# Patient Record
Sex: Female | Born: 1965 | Race: White | Hispanic: No | Marital: Married | Smoking: Never smoker
Health system: Southern US, Community
[De-identification: ages and names within clinical notes are randomized; demographics above are authoritative.]

## PROBLEM LIST (undated history)

## (undated) DIAGNOSIS — Z923 Personal history of irradiation: Secondary | ICD-10-CM

## (undated) DIAGNOSIS — D229 Melanocytic nevi, unspecified: Secondary | ICD-10-CM

## (undated) DIAGNOSIS — E079 Disorder of thyroid, unspecified: Secondary | ICD-10-CM

## (undated) DIAGNOSIS — E785 Hyperlipidemia, unspecified: Secondary | ICD-10-CM

## (undated) DIAGNOSIS — I10 Essential (primary) hypertension: Secondary | ICD-10-CM

## (undated) DIAGNOSIS — D039 Melanoma in situ, unspecified: Secondary | ICD-10-CM

## (undated) DIAGNOSIS — C50919 Malignant neoplasm of unspecified site of unspecified female breast: Secondary | ICD-10-CM

## (undated) DIAGNOSIS — Z8619 Personal history of other infectious and parasitic diseases: Secondary | ICD-10-CM

## (undated) DIAGNOSIS — C4491 Basal cell carcinoma of skin, unspecified: Secondary | ICD-10-CM

## (undated) HISTORY — DX: Personal history of other infectious and parasitic diseases: Z86.19

## (undated) HISTORY — DX: Disorder of thyroid, unspecified: E07.9

## (undated) HISTORY — DX: Essential (primary) hypertension: I10

## (undated) HISTORY — DX: Malignant neoplasm of unspecified site of unspecified female breast: C50.919

## (undated) HISTORY — PX: NO PAST SURGERIES: SHX2092

---
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
## 2014-06-28 DIAGNOSIS — C50919 Malignant neoplasm of unspecified site of unspecified female breast: Secondary | ICD-10-CM

## 2014-06-28 HISTORY — DX: Malignant neoplasm of unspecified site of unspecified female breast: C50.919

## 2014-07-12 ENCOUNTER — Telehealth (INDEPENDENT_AMBULATORY_CARE_PROVIDER_SITE_OTHER): Payer: Self-pay

## 2014-07-12 ENCOUNTER — Other Ambulatory Visit (INDEPENDENT_AMBULATORY_CARE_PROVIDER_SITE_OTHER): Payer: Self-pay | Admitting: Surgery

## 2014-07-12 DIAGNOSIS — C50911 Malignant neoplasm of unspecified site of right female breast: Secondary | ICD-10-CM

## 2014-07-12 NOTE — Telephone Encounter (Signed)
Pt seen in office today by Dr Ninfa Linden for new dx right br ca and orders placed in epic for breast mri,med/rad oncology,genetic testing,and surgery. Staff message sent to Vernell Barrier to notify her about the referrals that need to be scheduled.

## 2014-07-13 ENCOUNTER — Telehealth: Payer: Self-pay | Admitting: *Deleted

## 2014-07-13 NOTE — Telephone Encounter (Signed)
Received referral from [place]. Called and left a message for the pt to return my call so I can schedule her for genetic and for Med Onc.

## 2014-07-13 NOTE — Telephone Encounter (Signed)
Pt returned my call and I confirmed 07/19/14 genetic appt and 07/20/14 med onc appt w/ her.  Mailed before appt letter, welcoming packet & intake form to pt.  Emailed Anderson [place] and [place] at [facility] to make them aware.

## 2014-07-16 ENCOUNTER — Ambulatory Visit
Admission: RE | Admit: 2014-07-16 | Discharge: 2014-07-16 | Disposition: A | Discharge status: Home / Self Care | Payer: PRIVATE HEALTH INSURANCE | Source: Ambulatory Visit | Attending: Surgery | Admitting: Surgery

## 2014-07-16 DIAGNOSIS — C50911 Malignant neoplasm of unspecified site of right female breast: Secondary | ICD-10-CM

## 2014-07-16 MED ORDER — GADOBENATE DIMEGLUMINE 529 MG/ML IV SOLN
10.0000 mL | Freq: Once | INTRAVENOUS | Status: AC | PRN
  Administered 2014-07-16: 10 mL via INTRAVENOUS

## 2014-07-17 ENCOUNTER — Other Ambulatory Visit (INDEPENDENT_AMBULATORY_CARE_PROVIDER_SITE_OTHER): Payer: Self-pay | Admitting: Surgery

## 2014-07-17 DIAGNOSIS — C50911 Malignant neoplasm of unspecified site of right female breast: Secondary | ICD-10-CM

## 2014-07-19 ENCOUNTER — Ambulatory Visit (HOSPITAL_BASED_OUTPATIENT_CLINIC_OR_DEPARTMENT_OTHER): Payer: PRIVATE HEALTH INSURANCE | Admitting: Genetic Counselor

## 2014-07-19 ENCOUNTER — Other Ambulatory Visit: Payer: PRIVATE HEALTH INSURANCE

## 2014-07-19 ENCOUNTER — Encounter: Payer: Self-pay | Admitting: Genetic Counselor

## 2014-07-19 DIAGNOSIS — Z803 Family history of malignant neoplasm of breast: Secondary | ICD-10-CM | POA: Insufficient documentation

## 2014-07-19 DIAGNOSIS — C50911 Malignant neoplasm of unspecified site of right female breast: Secondary | ICD-10-CM

## 2014-07-19 DIAGNOSIS — C50411 Malignant neoplasm of upper-outer quadrant of right female breast: Secondary | ICD-10-CM | POA: Insufficient documentation

## 2014-07-19 DIAGNOSIS — Z800 Family history of malignant neoplasm of digestive organs: Secondary | ICD-10-CM | POA: Insufficient documentation

## 2014-07-19 DIAGNOSIS — Z315 Encounter for genetic counseling: Secondary | ICD-10-CM

## 2014-07-19 DIAGNOSIS — C50611 Malignant neoplasm of axillary tail of right female breast: Secondary | ICD-10-CM

## 2014-07-19 NOTE — Progress Notes (Signed)
HISTORY OF PRESENT ILLNESS: [name], a 48 y.o. female, was seen for a cancer genetics consultation at the [facility] due to a personal and family history of cancer.  Betty Foster presents to clinic today to discuss the possibility of a hereditary predisposition to cancer, genetic testing, and to further clarify her future cancer risks, as well as potential cancer risk for family members. She was accompanied to clinic by her father.   Past Medical History  Diagnosis Date   Breast cancer 06/2014    Invasive Lobular Carcinoma dx at age 44. Planning lumpectomy on 08/03/2014. Chemotherapy and radiation to be determined.    She has no history of other cancer and is in good general health.  FAMILY HISTORY:  During the visit, a 4-generation pedigree was obtained. Significant diagnoses include the following:  Family History  Problem Relation Age of Onset   Cancer Father 19    skin cancer, ? if melanoma   Cancer Maternal Aunt 63    colorectal cancer   Cancer Paternal Aunt 73    breast cancer   Cancer Paternal Uncle 85    prostate cancer   Cancer Maternal Grandmother 55    cervical or uterine cancer   Cancer Maternal Grandfather 72    lung cancer (smoker)    Betty Foster's ancestry is of Caucasian descent. There is no known Jewish ancestry or consanguinity.  GENETIC COUNSELING ASSESSMENT: Betty Foster is a 48 y.o. female with a personal and family history of cancer suggestive of a hereditary predisposition to cancer. We, therefore, discussed and recommended the following at today's visit.   DISCUSSION: We reviewed the characteristics, features and inheritance patterns of hereditary cancer syndromes. We also discussed genetic testing, including the appropriate family members to test, the process of testing, insurance coverage and turn-around-time for results. We discussed the implications of a negative, positive and/or variant of uncertain significant result. We recommended Ms.  Foster pursue genetic testing for the BRCA1 and BRCA2 genes and/or the OvaNext gene panel.   PLAN: Based on our above recommendation, Betty Foster wished to pursue genetic testing for teh BRCA1 and BRCA2 genes. She will consider gene panel testing if her BRCA testing is negative. Her blood sample was drawn and will be sent to [facility] for analysis. Results should be available within approximately 2 weeks time, at which point they will be disclosed by telephone to Betty Foster, as will any additional recommendations warranted by these results. We also encouraged Betty Foster to remain in contact with cancer genetics annually so that we can continuously update the family history and inform her of any changes in cancer genetics and testing that may be of benefit for this family. Ms.  Foster questions were answered to her satisfaction today. Our contact information was provided should additional questions or concerns arise.   Thank you for the referral and allowing [place] to share in the care of your patient.   The patient was seen for a total of 64mnutes in face-to-face genetic counseling.  This patient was discussed with Dr. MJana Hakimwho agrees with the above.    _______________________________________________________________________ For Office Staff:  Number of people involved in session: 3 Was an Intern/ student involved with case: not applicable

## 2014-07-20 ENCOUNTER — Telehealth: Payer: Self-pay | Admitting: Hematology and Oncology

## 2014-07-20 ENCOUNTER — Encounter: Payer: Self-pay | Admitting: Hematology and Oncology

## 2014-07-20 ENCOUNTER — Ambulatory Visit (HOSPITAL_BASED_OUTPATIENT_CLINIC_OR_DEPARTMENT_OTHER): Payer: PRIVATE HEALTH INSURANCE | Admitting: Hematology and Oncology

## 2014-07-20 ENCOUNTER — Ambulatory Visit: Payer: PRIVATE HEALTH INSURANCE

## 2014-07-20 VITALS — BP 162/83 | HR 68 | Temp 98.7°F | Resp 18 | Ht 62.0 in | Wt 117.5 lb

## 2014-07-20 DIAGNOSIS — K769 Liver disease, unspecified: Secondary | ICD-10-CM

## 2014-07-20 DIAGNOSIS — C50611 Malignant neoplasm of axillary tail of right female breast: Secondary | ICD-10-CM

## 2014-07-20 DIAGNOSIS — C50411 Malignant neoplasm of upper-outer quadrant of right female breast: Secondary | ICD-10-CM

## 2014-07-20 DIAGNOSIS — Z170 Estrogen receptor positive status [ER+]: Secondary | ICD-10-CM

## 2014-07-20 NOTE — Progress Notes (Signed)
Checked in new patient with no financial issues prior to seeing the dr. She has not been out of the country and she has my card and will call if any asst is needed.

## 2014-07-20 NOTE — Assessment & Plan Note (Signed)
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 N0 M0  Pathology counseling:Discussed with the patient, the details of pathology including the type of breast cancer,the clinical staging, the significance of ER, PR and HER-2/neu receptors and the implications for treatment. After reviewing the pathology in detail, we proceeded to discuss the different treatment options between surgery, radiation, chemotherapy, antiestrogen therapies.  Oncotype DX counseling: The final pathology reveals no evidence of lymph node metastases, I would recommended Oncotype DX testing. I explained to the patient that this is a 17 gene panel to evaluate patient tumors DNA and determine using a recurrence score whether she has high risk of intermediate risk of low risk breast cancer. She understands that if her tumor was found to be high risk, she would benefit from systemic chemotherapy. If she were to lower his that there would not be any need or benefit from chemotherapy. If she was found to be intermediate risk, we would need to evaluate the score as well as other risk factors and determine if an abbreviated chemotherapy may be of benefit.  Return to clinic after surgery to discuss final pathology and to discuss adjuvant treatment plan. Liver lesions: I suspect they're hemangiomas and benign. But in order to verify, we will obtain an MRI of the liver we will try to arrange it for next Wednesday when she is coming back for radiation oncology consultation to [facility].

## 2014-07-20 NOTE — Progress Notes (Signed)
[place] CONSULT NOTE  Patient has no care team.  CHIEF COMPLAINTS/PURPOSE OF CONSULTATION:  Newly diagnosed breast cancer  HISTORY OF PRESENTING ILLNESS:  Betty Foster 48 y.o. female is here because of recent diagnosis of right breast cancer. She underwent a routine screening mammogram that revealed an abnormality which required additional imaging studies which revealed a suspicious mass in the right breast and axillary tail. She then underwent a biopsy that confirmed that this was an infiltrating lobular carcinoma grade 2. This was ER/PR positive HER-2 negative. The biopsy was done in [place]. She then sought general surgery who obtained a breast MRI on 07/17/2014 that revealed a 1.5 cm spiculated mass in the right axillary tail. There were no additional lymphadenopathy. There were a few small cysts that were felt to be benign hemangiomas. She was sent to [place] for discussion regarding adjuvant treatment options.  I reviewed her records extensively and collaborated the history with the patient.  SUMMARY OF ONCOLOGIC HISTORY:   Breast cancer of upper-outer quadrant of right female breast   07/02/2014 Initial Diagnosis Infiltrating lobular carcinoma right breast grade 2   07/17/2014 Breast MRI Small spiculated mass 1.5 cm and right axillary tail, no lymph nodes, small hepatic cysts probably benign   MEDICAL HISTORY:  Past Medical History  Diagnosis Date  . Breast cancer 06/2014    Invasive Lobular Carcinoma dx at age 65. Planning lumpectomy on 08/03/2014. Chemotherapy and radiation to be determined.    SURGICAL HISTORY: History reviewed. No pertinent past surgical history.  SOCIAL HISTORY: History   Social History  . Marital Status: Married    Spouse Name: N/A    Number of Children: N/A  . Years of Education: N/A   Occupational History  . Not on file.   Social History Main Topics  . Smoking status: Never Smoker   . Smokeless tobacco: Never Used  . Alcohol Use:  No  . Drug Use: No  . Sexual Activity: Yes    Birth Control/ Protection: Surgical     Comment: husband - vasectomy   Other Topics Concern  . Not on file   Social History Narrative  . No narrative on file    FAMILY HISTORY: Family History  Problem Relation Age of Onset  . Cancer Father 96    skin cancer, ? if melanoma  . Cancer Maternal Aunt 63    colorectal cancer  . Cancer Paternal Aunt 5    breast cancer  . Cancer Paternal Uncle 40    prostate cancer  . Cancer Maternal Grandmother 55    cervical or uterine cancer  . Cancer Maternal Grandfather 58    lung cancer (smoker)    ALLERGIES:  has no allergies on file.  MEDICATIONS:  Current Outpatient Prescriptions  Medication Sig Dispense Refill  . atorvastatin (LIPITOR) 20 MG tablet Take 20 mg by mouth daily.       No current facility-administered medications for this visit.    REVIEW OF SYSTEMS:   Constitutional: Denies fevers, chills or abnormal night sweats Eyes: Denies blurriness of vision, double vision or watery eyes Ears, nose, mouth, throat, and face: Denies mucositis or sore throat Respiratory: Denies cough, dyspnea or wheezes Cardiovascular: Denies palpitation, chest discomfort or lower extremity swelling Gastrointestinal:  Denies nausea, heartburn or change in bowel habits Skin: Denies abnormal skin rashes Lymphatics: Denies new lymphadenopathy or easy bruising Neurological:Denies numbness, tingling or new weaknesses Behavioral/Psych: Mood is stable, no new changes  Breast: Palpable mass in the right breast  axillary tail All other systems were reviewed with the patient and are negative.  PHYSICAL EXAMINATION: ECOG PERFORMANCE STATUS: 0 - Asymptomatic  Filed Vitals:   07/20/14 1316  BP: 162/83  Pulse: 68  Temp: 98.7 F (37.1 C)  Resp: 18   Filed Weights   07/20/14 1316  Weight: 117 lb 8 oz (53.298 kg)    GENERAL:alert, no distress and comfortable SKIN: skin color, texture, turgor are  normal, no rashes or significant lesions EYES: normal, conjunctiva are pink and non-injected, sclera clear OROPHARYNX:no exudate, no erythema and lips, buccal mucosa, and tongue normal  NECK: supple, thyroid normal size, non-tender, without nodularity LYMPH:  no palpable lymphadenopathy in the cervical, axillary or inguinal LUNGS: clear to auscultation and percussion with normal breathing effort HEART: regular rate & rhythm and no murmurs and no lower extremity edema ABDOMEN:abdomen soft, non-tender and normal bowel sounds Musculoskeletal:no cyanosis of digits and no clubbing  PSYCH: alert & oriented x 3 with fluent speech NEURO: no focal motor/sensory deficits BREAST: Palpable mass in the right breast axillary tail. No palpable axillary or supraclavicular lymphadenopathy  RADIOGRAPHIC STUDIES: I have personally reviewed the radiological reports and agreed with the findings in the report.  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 N0 M0  Pathology counseling:Discussed with the patient, the details of pathology including the type of breast cancer,the clinical staging, the significance of ER, PR and HER-2/neu receptors and the implications for treatment. After reviewing the pathology in detail, we proceeded to discuss the different treatment options between surgery, radiation, chemotherapy, antiestrogen therapies.  Oncotype DX counseling: The final pathology reveals no evidence of lymph node metastases, I would recommended Oncotype DX testing. I explained to the patient that this is a 17 gene panel to evaluate patient tumors DNA and determine using a recurrence score whether she has high risk of intermediate risk of low risk breast cancer. She understands that if her tumor was found to be high risk, she would  benefit from systemic chemotherapy. If she were to lower his that there would not be any need or benefit from chemotherapy. If she was found to be intermediate risk, we would need to evaluate the score as well as other risk factors and determine if an abbreviated chemotherapy may be of benefit.  Return to clinic after surgery to discuss final pathology and to discuss adjuvant treatment plan. Liver lesions: I suspect they're hemangiomas and benign. But in order to verify, we will obtain an MRI of the liver we will try to arrange it for next Wednesday when she is coming back for radiation oncology consultation to [facility].   All questions were answered. The patient knows to call the clinic with any problems, questions or concerns. I spent 40 minutes counseling the patient face to face. The total time spent in the appointment was 60 minutes and more than 50% was on counseling.     Rulon Eisenmenger, MD 07/20/2014 5:16 PM

## 2014-07-20 NOTE — Telephone Encounter (Signed)
LM to confirm appt for 11/13. [facility] call to sch MRI around appt time for radiation.

## 2014-07-23 ENCOUNTER — Encounter: Payer: Self-pay | Admitting: Radiation Oncology

## 2014-07-23 NOTE — Progress Notes (Signed)
Location of Breast Cancer: Right breast  Right upper quadrant  Histology per Pathology Report: 07/02/2014: U/S biopsy=Infiltrating lobular carcinoma (0/0) nodes examined, 2-3c  Receptor Status: ER( 3+  ), PR ( 3+  ), Her2-neu ( neg  )  Did patient present with symptoms (if so, please note symptoms) or was this found on screening mammography?: tenderness in right breast but for regular screening mammogram 06/26/14  Past/Anticipated interventions by surgeon, if any: 08/03/14 scheduled surgery for  lumpectomy   Past/Anticipated interventions by medical oncology, if any: Chemotherapy : saw Dr. Gudena 07/20/14, follow up 08/10/14, patient to have  Genetic testing for BRCA1&BRCA2 genes sent 07/19/14 for analysis results pending.  Lymphedema issues, if any:   no  Pain issues, if any:    SAFETY ISSUES:  Prior radiation? No  Pacemaker/ICD? No  Possible current pregnancy? No  Is the patient on methotrexate? No  Current Complaints / other details: Married,   Non smoker, father skin cancer,age 70,? Melanoma, Maternal aunt age 63 colorectal cancer, paternal aunt 70=breast cancer,maternal grandmother age 55=cervical or uterine cancer, maternal grandfather age 72=lung cancer, paternal uncle age 74=prostate cancer,   NKDA McElroy, Betty Bruner, Betty Foster 07/23/2014,11:08 AM   

## 2014-07-24 ENCOUNTER — Telehealth: Payer: Self-pay

## 2014-07-24 NOTE — Telephone Encounter (Signed)
LMOVM - Schedule for MRI 10/29 at 12 - arrive at 1145.  Pt to call clinic if she has any questions.

## 2014-07-25 ENCOUNTER — Ambulatory Visit: Payer: PRIVATE HEALTH INSURANCE

## 2014-07-25 ENCOUNTER — Ambulatory Visit
Admission: RE | Admit: 2014-07-25 | Discharge: 2014-07-25 | Disposition: A | Discharge status: Home / Self Care | Payer: PRIVATE HEALTH INSURANCE | Source: Ambulatory Visit | Attending: Radiation Oncology | Admitting: Radiation Oncology

## 2014-07-25 ENCOUNTER — Other Ambulatory Visit (INDEPENDENT_AMBULATORY_CARE_PROVIDER_SITE_OTHER): Payer: Self-pay | Admitting: Surgery

## 2014-07-25 DIAGNOSIS — Z510 Encounter for antineoplastic radiation therapy: Secondary | ICD-10-CM | POA: Insufficient documentation

## 2014-07-25 DIAGNOSIS — C50411 Malignant neoplasm of upper-outer quadrant of right female breast: Secondary | ICD-10-CM | POA: Insufficient documentation

## 2014-07-26 ENCOUNTER — Other Ambulatory Visit: Payer: Self-pay | Admitting: Hematology and Oncology

## 2014-07-26 ENCOUNTER — Other Ambulatory Visit: Payer: Self-pay | Admitting: *Deleted

## 2014-07-26 ENCOUNTER — Ambulatory Visit (HOSPITAL_COMMUNITY)
Admission: RE | Admit: 2014-07-26 | Discharge: 2014-07-26 | Disposition: A | Discharge status: Home / Self Care | Payer: PRIVATE HEALTH INSURANCE | Source: Ambulatory Visit | Attending: Diagnostic Radiology | Admitting: Diagnostic Radiology

## 2014-07-26 DIAGNOSIS — C50411 Malignant neoplasm of upper-outer quadrant of right female breast: Secondary | ICD-10-CM | POA: Insufficient documentation

## 2014-07-26 DIAGNOSIS — D1803 Hemangioma of intra-abdominal structures: Secondary | ICD-10-CM | POA: Diagnosis not present

## 2014-07-26 MED ORDER — GADOBENATE DIMEGLUMINE 529 MG/ML IV SOLN
10.0000 mL | Freq: Once | INTRAVENOUS | Status: AC | PRN
  Administered 2014-07-26: 10 mL via INTRAVENOUS

## 2014-07-29 HISTORY — PX: BREAST LUMPECTOMY: SHX2

## 2014-07-31 ENCOUNTER — Encounter: Payer: Self-pay | Admitting: Genetic Counselor

## 2014-07-31 ENCOUNTER — Encounter (HOSPITAL_BASED_OUTPATIENT_CLINIC_OR_DEPARTMENT_OTHER): Payer: Self-pay | Admitting: *Deleted

## 2014-07-31 DIAGNOSIS — C50411 Malignant neoplasm of upper-outer quadrant of right female breast: Secondary | ICD-10-CM

## 2014-07-31 DIAGNOSIS — Z803 Family history of malignant neoplasm of breast: Secondary | ICD-10-CM

## 2014-07-31 DIAGNOSIS — Z800 Family history of malignant neoplasm of digestive organs: Secondary | ICD-10-CM

## 2014-07-31 NOTE — Progress Notes (Signed)
Expand All Collapse All   Location of Breast Cancer: Right breast Right upper quadrant  Histology per Pathology Report: 07/02/2014: U/S biopsy=Infiltrating lobular carcinoma (0/0) nodes examined, 2-3c  07/25/14 right breast biopsy= Invasive mammary Carcinoma,appears grade II   Receptor Status: ER( 3+ ), PR ( 3+ ), Her2-neu ( neg )  Did patient present with symptoms (if so, please note symptoms) or was this found on screening mammography?: tenderness in right breast but for regular screening mammogram 06/26/14  Past/Anticipated interventions by surgeon, if any: 08/03/14 scheduled surgery for lumpectomy   Past/Anticipated interventions by medical oncology, if any: Chemotherapy : saw Dr. Gudena 07/20/14, follow up 08/10/14, patient to have Genetic testing for BRCA1&BRCA2 genes sent 07/19/14 for analysis results normal=did not reveal a deleterious mutation in either of thes genes,07/31/14 note from Catherine Fine   Lymphedema issues, if any: no  Pain issues, if any: none  SAFETY ISSUES:  Prior radiation? No  Pacemaker/ICD? No  Possible current pregnancy? No  Is the patient on methotrexate? No  Current Complaints / other details: Married,G3P2, 1 miscarriage, menses age 16, 1st pregnanacy age 24,  No HRT, birth control pils age [age] Non smoker, father skin cancer,age 70,? Melanoma, Maternal aunt age 63 colorectal cancer, paternal aunt 70=breast cancer,maternal grandmother age 55=cervical or uterine cancer, maternal grandfather age 72=lung cancer, paternal uncle age 74=prostate cancer,  NKDA      

## 2014-07-31 NOTE — Progress Notes (Signed)
HPI:  [name] was previously seen in the [place] clinic due to a personal and family history of cancer and concerns regarding a hereditary predisposition to cancer. Please refer to our prior cancer genetics clinic note for more information regarding [name]'s medical, social and family histories, and our assessment and recommendations, at the time. [name] recent genetic test results were disclosed to her, as were recommendations warranted by these results. These results and recommendations are discussed in more detail below.  GENETIC TEST RESULTS: At the time of [name]'s visit, we recommended she pursue genetic testing of the BRCA1 and BRCA2 genes. This test, which included sequencing and deletion/duplication analysis of the genes, was performed at [facility]. Genetic testing was normal, and did not reveal a deleterious mutation in either of these genes. A complete test report will be scanned into EPIC.    We discussed with [name] that since the current genetic testing is not perfect, it is possible there may be a gene mutation in one of these genes that current testing cannot detect, but that chance is small.  We also discussed, that it is possible that another gene that has not yet been discovered, or that we have not yet tested, is responsible for the cancer diagnoses in the family, and it is, therefore, important to remain in touch with cancer genetics in the future so that we can continue to offer [name] the most up to date genetic testing.   ADDITIONAL GENETIC TESTING: We discussed with [name] that there are other genes that are associated with increased cancer risk that can be analyzed. The laboratories that offer such testing look at these additional genes via a hereditary cancer gene panel. Should [name] wish to pursue such testing, we are happy to discuss and coordinate this testing at any time.    CANCER SCREENING RECOMMENDATIONS: This  result is reassuring and suggests that [name]'s cancer was most likely not due to an inherited predisposition associated with one of these genes.  Most cancers happen by chance and this negative test suggests that her cancer falls into this category.  We, therefore, recommended she continue to follow the cancer management and screening guidelines provided by her oncology and primary providers.   RECOMMENDATIONS FOR FAMILY MEMBERS:  Women in this family might be at some increased risk of developing cancer, over the general population risk, simply due to the family history of cancer.  We recommended women in this family have a yearly mammogram beginning at age 76, an an annual clinical breast exam, and perform monthly breast self-exams. Women in this family should also have a gynecological exam as recommended by their primary provider. All family members should have a colonoscopy by age 63.  FOLLOW-UP: Lastly, we discussed with [name] that cancer genetics is a rapidly advancing field and it is possible that new genetic tests will be appropriate for her and/or her family members in the future. We encouraged her to remain in contact with cancer genetics on an annual basis so we can update her personal and family histories and let her know of advances in cancer genetics that may benefit this family.   Our contact number was provided. [name] questions were answered to her satisfaction, and she knows she is welcome to call us at anytime with additional questions or concerns.   Catherine A. Fine, MS, CGC Certified Psychologist, sport [email or web address]

## 2014-08-01 ENCOUNTER — Encounter: Payer: Self-pay | Admitting: Radiation Oncology

## 2014-08-01 ENCOUNTER — Ambulatory Visit
Admission: RE | Admit: 2014-08-01 | Discharge: 2014-08-01 | Disposition: A | Discharge status: Home / Self Care | Payer: PRIVATE HEALTH INSURANCE | Source: Ambulatory Visit | Attending: Radiation Oncology | Admitting: Radiation Oncology

## 2014-08-01 VITALS — BP 139/87 | HR 67 | Temp 98.7°F | Resp 20 | Ht 62.0 in | Wt 120.8 lb

## 2014-08-01 DIAGNOSIS — C50411 Malignant neoplasm of upper-outer quadrant of right female breast: Secondary | ICD-10-CM

## 2014-08-01 DIAGNOSIS — Z510 Encounter for antineoplastic radiation therapy: Secondary | ICD-10-CM | POA: Diagnosis not present

## 2014-08-01 NOTE — Progress Notes (Signed)
Please see the Nurse Progress Note in the MD Initial Consult Encounter for this patient. 

## 2014-08-02 NOTE — H&P (Signed)
Betty Foster 07/12/2014 9:33 AM Location: [facility] Patient #: [id number] DOB: June 15, 1966 Married / Language: English / Race: White Female  History of Present Illness (Nasira Janusz A. Ninfa Linden MD; 07/12/2014 10:01 AM) Patient words: Eval. new consult for breast cancer.  The patient is a 48 year old female who presents with breast cancer. This is a pleasant female referred by Dr.Maute in [place] for the recent diagnosis of a right breast cancer. She has dense breasts. She was sent for a followup mammogram after screening mammography showed densities in the breast. The followup mammography and ultrasound demonstrated a spiculated mass in the upper outer quadrant of the right breast. It measured 1.1 cm on mammogram but only 0.8 cm on ultrasound. Ultrasound-guided core biopsy was performed and the biopsy results showed invasive lobular carcinoma. Receptor status is currently pending. She has had no previous problems with her breast. She has no history of having had previous biopsies. She denies nipple discharge. Her family history is negative for breast cancer   Other Problems Illene Regulus, MA; 07/12/2014 9:33 AM) Breast Cancer Hypercholesterolemia  Past Surgical History Illene Regulus, MA; 07/12/2014 9:33 AM) Breast Biopsy Right.  Diagnostic Studies History Lars Mage [place], [place]; 07/12/2014 9:33 AM) Colonoscopy never Mammogram within last year Pap Smear 1-5 years ago  Allergies Illene Regulus, MA; 07/12/2014 9:41 AM) No Known Drug Allergies10/15/2015  Medication History (Alisha Spillers, MA; 07/12/2014 9:41 AM) Atorvastatin Calcium (20MG  Tablet, Oral) Active. Medications Reconciled  Social History Lars Mage [place], [place]; 07/12/2014 9:33 AM) Alcohol use Occasional alcohol use. No drug use Tobacco use Never smoker.  Family History Lars Mage [place], [place]; 07/12/2014 9:33 AM) Arthritis Father. Diabetes Mellitus Father. Hypertension Father,  Mother. Melanoma Father. Thyroid problems Mother.  Pregnancy / Birth History Lars Mage [place], [place]; 07/12/2014 9:33 AM) Age at menarche 4 years. Contraceptive History Oral contraceptives. Gravida 3 Maternal age 18-25 Para 2 Regular periods  Review of Systems Lars Mage [place]; 07/12/2014 9:33 AM) General Not Present- Appetite Loss, Chills, Fatigue, Fever, Night Sweats, Weight Gain and Weight Loss. Skin Not Present- Change in Wart/Mole, Dryness, Hives, Jaundice, New Lesions, Non-Healing Wounds, Rash and Ulcer. HEENT Not Present- Earache, Hearing Loss, Hoarseness, Nose Bleed, Oral Ulcers, Ringing in the Ears, Seasonal Allergies, Sinus Pain, Sore Throat, Visual Disturbances, Wears glasses/contact lenses and Yellow Eyes. Respiratory Not Present- Bloody sputum, Chronic Cough, Difficulty Breathing, Snoring and Wheezing. Breast Present- Breast Mass. Not Present- Breast Pain, Nipple Discharge and Skin Changes. Cardiovascular Not Present- Chest Pain, Difficulty Breathing Lying Down, Leg Cramps, Palpitations, Rapid Heart Rate, Shortness of Breath and Swelling of Extremities. Gastrointestinal Not Present- Abdominal Pain, Bloating, Bloody Stool, Change in Bowel Habits, Chronic diarrhea, Constipation, Difficulty Swallowing, Excessive gas, Gets full quickly at meals, Hemorrhoids, Indigestion, Nausea, Rectal Pain and Vomiting. Female Genitourinary Not Present- Frequency, Nocturia, Painful Urination, Pelvic Pain and Urgency. Musculoskeletal Not Present- Back Pain, Joint Pain, Joint Stiffness, Muscle Pain, Muscle Weakness and Swelling of Extremities. Neurological Not Present- Decreased Memory, Fainting, Headaches, Numbness, Seizures, Tingling, Tremor, Trouble walking and Weakness. Psychiatric Not Present- Anxiety, Bipolar, Change in Sleep Pattern, Depression, Fearful and Frequent crying. Endocrine Not Present- Cold Intolerance, Excessive Hunger, Hair Changes, Heat Intolerance, Hot flashes and New  Diabetes. Hematology Not Present- Easy Bruising, Excessive bleeding, Gland problems, HIV and Persistent Infections.   Vitals (Alisha Spillers MA; 07/12/2014 9:40 AM) 07/12/2014 9:40 AM Weight: 117 lb Height: 62in Body Surface Area: 1.52 m Body Mass Index: 21.4 kg/m Pulse: 72 (Regular)  BP: 136/78 (Sitting, Left Arm, Standard)    Physical Exam (  Laila Myhre A. Ninfa Linden MD; 07/12/2014 10:04 AM) The physical exam findings are as follows: Note:Well-developed, well-nourished in no distress  Head and Neck Note: external ears and nose are normal, hearing is normal, oropharynx clear neck supple, no cervical adenopathy, no supraclavicular adenopathy, trachea is midline   Eye Note: pupils reactive bilaterally, anicteric   Chest and Lung Exam Note: clear t to auscultation bilaterally with normal respiratory effort   Breast Note: slig.t bruising at the biopsy site on the right breast.No palpable masses in either breast. Nipple and areola are normal bilaterally. No axillary adenopathy on either side   Cardiovascular Note: regular rate and rhythm with normal peripheral pulses   Abdomen Note: soft, nontender, nondistended   Neurologic Note: awake, alert, and oriented   Neuropsychiatric Note: judgment and affect are normal   Musculoskeletal Note: no long bone abnormalities     Assessment & Plan (Kameisha Malicki A. Ninfa Linden MD; 07/12/2014 10:05 AM) PRIMARY CANCER OF UPPER OUTER QUADRANT OF RIGHT BREAST (174.4  C50.411) Impression: I discussed the diagnosis with the patient and her husband in detail. I discussed removal of the cancer with breast conservation verses mastectomy. I discussed radiation as well as sentinel lymph node biopsy. After long discussion, they wished to proceed with a needle localized right breast lumpectomy and sentinel lymph node biopsy. I will go ahead and get her referred to the [facility]. I believe she does need genetic counseling as she  is young and has 2 daughters although her family history is negative. I discussed the surgical procedure in detail. I discussed the risks of surgery which includes but is not limited to bleeding, infection, need for further surgery at the margins or lymph nodes are positive, chronic arm swelling, injury to surrounding structures, etc. I also discussed postoperative recovery.

## 2014-08-03 ENCOUNTER — Ambulatory Visit
Admission: RE | Admit: 2014-08-03 | Discharge: 2014-08-03 | Disposition: A | Discharge status: Home / Self Care | Payer: PRIVATE HEALTH INSURANCE | Source: Ambulatory Visit | Attending: Surgery | Admitting: Surgery

## 2014-08-03 ENCOUNTER — Ambulatory Visit (HOSPITAL_BASED_OUTPATIENT_CLINIC_OR_DEPARTMENT_OTHER)
Admission: RE | Admit: 2014-08-03 | Discharge: 2014-08-03 | Disposition: A | Discharge status: Home / Self Care | Payer: PRIVATE HEALTH INSURANCE | Source: Ambulatory Visit | Attending: Surgery | Admitting: Surgery

## 2014-08-03 ENCOUNTER — Encounter (HOSPITAL_BASED_OUTPATIENT_CLINIC_OR_DEPARTMENT_OTHER): Payer: Self-pay | Admitting: *Deleted

## 2014-08-03 ENCOUNTER — Ambulatory Visit (HOSPITAL_COMMUNITY)
Admission: RE | Admit: 2014-08-03 | Discharge: 2014-08-03 | Disposition: A | Discharge status: Home / Self Care | Payer: PRIVATE HEALTH INSURANCE | Source: Ambulatory Visit | Attending: Surgery | Admitting: Surgery

## 2014-08-03 ENCOUNTER — Encounter (HOSPITAL_BASED_OUTPATIENT_CLINIC_OR_DEPARTMENT_OTHER)
Admission: RE | Disposition: A | Discharge status: Home / Self Care | Payer: Self-pay | Source: Ambulatory Visit | Attending: Surgery

## 2014-08-03 ENCOUNTER — Other Ambulatory Visit (INDEPENDENT_AMBULATORY_CARE_PROVIDER_SITE_OTHER): Payer: Self-pay | Admitting: Surgery

## 2014-08-03 ENCOUNTER — Ambulatory Visit (HOSPITAL_BASED_OUTPATIENT_CLINIC_OR_DEPARTMENT_OTHER): Payer: PRIVATE HEALTH INSURANCE | Admitting: Anesthesiology

## 2014-08-03 ENCOUNTER — Encounter: Payer: Self-pay | Admitting: Radiation Oncology

## 2014-08-03 DIAGNOSIS — E780 Pure hypercholesterolemia: Secondary | ICD-10-CM | POA: Diagnosis not present

## 2014-08-03 DIAGNOSIS — C50911 Malignant neoplasm of unspecified site of right female breast: Secondary | ICD-10-CM | POA: Insufficient documentation

## 2014-08-03 DIAGNOSIS — Z800 Family history of malignant neoplasm of digestive organs: Secondary | ICD-10-CM | POA: Insufficient documentation

## 2014-08-03 DIAGNOSIS — Z803 Family history of malignant neoplasm of breast: Secondary | ICD-10-CM | POA: Diagnosis not present

## 2014-08-03 DIAGNOSIS — N641 Fat necrosis of breast: Secondary | ICD-10-CM | POA: Insufficient documentation

## 2014-08-03 DIAGNOSIS — N6011 Diffuse cystic mastopathy of right breast: Secondary | ICD-10-CM | POA: Diagnosis not present

## 2014-08-03 HISTORY — DX: Hyperlipidemia, unspecified: E78.5

## 2014-08-03 HISTORY — PX: BREAST LUMPECTOMY WITH NEEDLE LOCALIZATION AND AXILLARY SENTINEL LYMPH NODE BX: SHX5760

## 2014-08-03 LAB — POCT HEMOGLOBIN-HEMACUE: HEMOGLOBIN: 13.6 g/dL (ref 12.0–15.0)

## 2014-08-03 SURGERY — BREAST LUMPECTOMY WITH NEEDLE LOCALIZATION AND AXILLARY SENTINEL LYMPH NODE BX
Anesthesia: Regional | Site: Breast | Laterality: Right

## 2014-08-03 MED ORDER — CEFAZOLIN SODIUM-DEXTROSE 2-3 GM-% IV SOLR
INTRAVENOUS | Status: AC
Start: 2014-08-03 — End: 2014-08-03
  Filled 2014-08-03: qty 50

## 2014-08-03 MED ORDER — OXYCODONE HCL 5 MG PO TABS: 5.0000 mg | ORAL_TABLET | Freq: Once | ORAL | Status: DC | PRN

## 2014-08-03 MED ORDER — METHYLENE BLUE 1 % INJ SOLN
INTRAMUSCULAR | Status: AC
  Filled 2014-08-03: qty 10

## 2014-08-03 MED ORDER — PROMETHAZINE HCL 25 MG/ML IJ SOLN: 6.2500 mg | INTRAMUSCULAR | Status: DC | PRN

## 2014-08-03 MED ORDER — BUPIVACAINE-EPINEPHRINE 0.25% -1:200000 IJ SOLN
INTRAMUSCULAR | Status: DC | PRN
  Administered 2014-08-03: 10 mL

## 2014-08-03 MED ORDER — FENTANYL CITRATE 0.05 MG/ML IJ SOLN
INTRAMUSCULAR | Status: DC | PRN
  Administered 2014-08-03: 25 ug via INTRAVENOUS
  Administered 2014-08-03: 50 ug via INTRAVENOUS
  Administered 2014-08-03: 25 ug via INTRAVENOUS

## 2014-08-03 MED ORDER — MIDAZOLAM HCL 2 MG/2ML IJ SOLN
1.0000 mg | INTRAMUSCULAR | Status: DC | PRN
  Administered 2014-08-03: 1.5 mg via INTRAVENOUS

## 2014-08-03 MED ORDER — MIDAZOLAM HCL 2 MG/2ML IJ SOLN
INTRAMUSCULAR | Status: AC
Start: 2014-08-03 — End: 2014-08-03
  Filled 2014-08-03: qty 2

## 2014-08-03 MED ORDER — BUPIVACAINE HCL (PF) 0.25 % IJ SOLN
INTRAMUSCULAR | Status: AC
  Filled 2014-08-03: qty 30

## 2014-08-03 MED ORDER — HYDROMORPHONE HCL 1 MG/ML IJ SOLN
INTRAMUSCULAR | Status: AC
  Filled 2014-08-03: qty 1

## 2014-08-03 MED ORDER — SODIUM CHLORIDE 0.9 % IJ SOLN
INTRAMUSCULAR | Status: AC
  Filled 2014-08-03: qty 10

## 2014-08-03 MED ORDER — PROPOFOL 10 MG/ML IV BOLUS
INTRAVENOUS | Status: DC | PRN
  Administered 2014-08-03: 150 mg via INTRAVENOUS

## 2014-08-03 MED ORDER — FENTANYL CITRATE 0.05 MG/ML IJ SOLN
INTRAMUSCULAR | Status: AC
  Filled 2014-08-03: qty 6

## 2014-08-03 MED ORDER — BUPIVACAINE-EPINEPHRINE (PF) 0.25% -1:200000 IJ SOLN
INTRAMUSCULAR | Status: AC
  Filled 2014-08-03: qty 30

## 2014-08-03 MED ORDER — LIDOCAINE HCL (CARDIAC) 20 MG/ML IV SOLN
INTRAVENOUS | Status: DC | PRN
  Administered 2014-08-03: 50 mg via INTRAVENOUS

## 2014-08-03 MED ORDER — HYDROMORPHONE HCL 1 MG/ML IJ SOLN
0.2500 mg | INTRAMUSCULAR | Status: DC | PRN
  Administered 2014-08-03 (×4): 0.5 mg via INTRAVENOUS

## 2014-08-03 MED ORDER — CEFAZOLIN SODIUM-DEXTROSE 2-3 GM-% IV SOLR
2.0000 g | INTRAVENOUS | Status: AC
  Administered 2014-08-03: 2 g via INTRAVENOUS

## 2014-08-03 MED ORDER — MIDAZOLAM HCL 2 MG/2ML IJ SOLN
INTRAMUSCULAR | Status: AC
  Filled 2014-08-03: qty 2

## 2014-08-03 MED ORDER — TECHNETIUM TC 99M SULFUR COLLOID FILTERED
1.0000 | Freq: Once | INTRAVENOUS | Status: AC | PRN
  Administered 2014-08-03: 1 via INTRADERMAL

## 2014-08-03 MED ORDER — METHYLENE BLUE 1 % INJ SOLN
INTRAMUSCULAR | Status: DC | PRN
  Administered 2014-08-03: 3 mL via INTRAMUSCULAR

## 2014-08-03 MED ORDER — FENTANYL CITRATE 0.05 MG/ML IJ SOLN
INTRAMUSCULAR | Status: AC
  Filled 2014-08-03: qty 2

## 2014-08-03 MED ORDER — FENTANYL CITRATE 0.05 MG/ML IJ SOLN
50.0000 ug | INTRAMUSCULAR | Status: DC | PRN
  Administered 2014-08-03: 75 ug via INTRAVENOUS

## 2014-08-03 MED ORDER — LACTATED RINGERS IV SOLN
INTRAVENOUS | Status: DC
  Administered 2014-08-03 (×3): via INTRAVENOUS

## 2014-08-03 MED ORDER — BUPIVACAINE-EPINEPHRINE (PF) 0.5% -1:200000 IJ SOLN
INTRAMUSCULAR | Status: DC | PRN
  Administered 2014-08-03: 25 mL

## 2014-08-03 MED ORDER — DEXAMETHASONE SODIUM PHOSPHATE 4 MG/ML IJ SOLN
INTRAMUSCULAR | Status: DC | PRN
  Administered 2014-08-03: 10 mg via INTRAVENOUS

## 2014-08-03 MED ORDER — KETOROLAC TROMETHAMINE 30 MG/ML IJ SOLN
INTRAMUSCULAR | Status: DC | PRN
  Administered 2014-08-03: 30 mg via INTRAVENOUS

## 2014-08-03 MED ORDER — OXYCODONE HCL 5 MG/5ML PO SOLN: 5.0000 mg | Freq: Once | ORAL | Status: DC | PRN

## 2014-08-03 MED ORDER — HYDROCODONE-ACETAMINOPHEN 5-325 MG PO TABS: 1.0000 | ORAL_TABLET | ORAL | Status: DC | PRN

## 2014-08-03 MED ORDER — ONDANSETRON HCL 4 MG/2ML IJ SOLN
INTRAMUSCULAR | Status: DC | PRN
  Administered 2014-08-03: 4 mg via INTRAVENOUS

## 2014-08-03 SURGICAL SUPPLY — 42 items
APPLIER CLIP 9.375 MED OPEN (MISCELLANEOUS) ×2
BENZOIN TINCTURE PRP APPL 2/3 (GAUZE/BANDAGES/DRESSINGS) ×2 IMPLANT
BINDER BREAST LRG (GAUZE/BANDAGES/DRESSINGS) IMPLANT
BINDER BREAST XLRG (GAUZE/BANDAGES/DRESSINGS) IMPLANT
BLADE HEX COATED 2.75 (ELECTRODE) ×2 IMPLANT
BLADE SURG 15 STRL LF DISP TIS (BLADE) ×1 IMPLANT
BLADE SURG 15 STRL SS (BLADE) ×1
CANISTER SUCT 3000ML (MISCELLANEOUS) ×2 IMPLANT
CHLORAPREP W/TINT 26ML (MISCELLANEOUS) ×2 IMPLANT
CLIP APPLIE 9.375 MED OPEN (MISCELLANEOUS) ×1 IMPLANT
COVER BACK TABLE 60X90IN (DRAPES) ×2 IMPLANT
COVER MAYO STAND STRL (DRAPES) ×2 IMPLANT
COVER PROBE W GEL 5X96 (DRAPES) ×2 IMPLANT
DEVICE DUBIN W/COMP PLATE 8390 (MISCELLANEOUS) ×2 IMPLANT
DRAPE LAPAROSCOPIC ABDOMINAL (DRAPES) ×2 IMPLANT
DRAPE UTILITY XL STRL (DRAPES) ×2 IMPLANT
DRSG TEGADERM 4X4.75 (GAUZE/BANDAGES/DRESSINGS) ×4 IMPLANT
ELECT REM PT RETURN 9FT ADLT (ELECTROSURGICAL) ×2
ELECTRODE REM PT RTRN 9FT ADLT (ELECTROSURGICAL) ×1 IMPLANT
GAUZE SPONGE 4X4 12PLY STRL (GAUZE/BANDAGES/DRESSINGS) ×2 IMPLANT
GLOVE SURG SIGNA 7.5 PF LTX (GLOVE) ×2 IMPLANT
GOWN STRL REUS W/ TWL LRG LVL3 (GOWN DISPOSABLE) ×1 IMPLANT
GOWN STRL REUS W/TWL LRG LVL3 (GOWN DISPOSABLE) ×1
KIT MARKER MARGIN INK (KITS) ×2 IMPLANT
NDL SAFETY ECLIPSE 18X1.5 (NEEDLE) ×1 IMPLANT
NEEDLE HYPO 18GX1.5 SHARP (NEEDLE) ×1
NEEDLE HYPO 25X1 1.5 SAFETY (NEEDLE) ×2 IMPLANT
NS IRRIG 1000ML POUR BTL (IV SOLUTION) ×2 IMPLANT
PACK BASIN DAY SURGERY FS (CUSTOM PROCEDURE TRAY) ×2 IMPLANT
PENCIL BUTTON HOLSTER BLD 10FT (ELECTRODE) ×2 IMPLANT
SPONGE LAP 4X18 X RAY DECT (DISPOSABLE) ×2 IMPLANT
STRIP CLOSURE SKIN 1/2X4 (GAUZE/BANDAGES/DRESSINGS) ×2 IMPLANT
SUT MON AB 4-0 PC3 18 (SUTURE) ×2 IMPLANT
SUT SILK 2 0 SH (SUTURE) IMPLANT
SUT VIC AB 2-0 SH 18 (SUTURE) IMPLANT
SUT VIC AB 3-0 SH 27 (SUTURE) ×1
SUT VIC AB 3-0 SH 27X BRD (SUTURE) ×1 IMPLANT
SYR BULB 3OZ (MISCELLANEOUS) ×2 IMPLANT
SYR CONTROL 10ML LL (SYRINGE) ×2 IMPLANT
TOWEL OR 17X24 6PK STRL BLUE (TOWEL DISPOSABLE) ×2 IMPLANT
TUBE CONNECTING 20X1/4 (TUBING) ×2 IMPLANT
YANKAUER SUCT BULB TIP NO VENT (SUCTIONS) ×2 IMPLANT

## 2014-08-03 NOTE — Anesthesia Preprocedure Evaluation (Signed)
Anesthesia Evaluation  Patient identified by MRN, date of birth, ID band Patient awake    Reviewed: Allergy & Precautions, H&P , NPO status   History of Anesthesia Complications Negative for: history of anesthetic complications  Airway Mallampati: I       Dental   Pulmonary neg pulmonary ROS,  breath sounds clear to auscultation        Cardiovascular negative cardio ROS  Rhythm:Regular Rate:Normal     Neuro/Psych negative neurological ROS     GI/Hepatic Neg liver ROS,   Endo/Other  negative endocrine ROS  Renal/GU negative Renal ROS     Musculoskeletal   Abdominal   Peds  Hematology negative hematology ROS (+)   Anesthesia Other Findings   Reproductive/Obstetrics                             Anesthesia Physical Anesthesia Plan  ASA: I  Anesthesia Plan: General and Regional   Post-op Pain Management:    Induction: Intravenous  Airway Management Planned: LMA  Additional Equipment:   Intra-op Plan:   Post-operative Plan: Extubation in OR  Informed Consent: I have reviewed the patients History and Physical, chart, labs and discussed the procedure including the risks, benefits and alternatives for the proposed anesthesia with the patient or authorized representative who has indicated his/her understanding and acceptance.   Dental advisory given  Plan Discussed with: CRNA and Surgeon  Anesthesia Plan Comments:         Anesthesia Quick Evaluation

## 2014-08-03 NOTE — Op Note (Signed)
NEEDLE LOCALIZED RIGHT BREAST LUMPECTOMY AND RIGHT  SENTINEL LYMPH NODE BX  Procedure Note  Betty Foster 08/03/2014   Pre-op Diagnosis: Right Breast Cancer     Post-op Diagnosis: SAME  Procedure(s): NEEDLE LOCALIZED RIGHT BREAST PARTIAL MASTECTOMY AND RIGHT  SENTINEL LYMPH NODE BX INJECTION OF BLUE DYE  Surgeon(s): Coralie Keens, MD  Anesthesia: General  Staff:  Circulator: Lauris Chroman, RN Scrub Person: Vara Guardian, RN  Estimated Blood Loss: Minimal               Specimens: sent to path          [facility]   Date: 08/03/2014  Time: 3:38 PM

## 2014-08-03 NOTE — Progress Notes (Signed)
Radiology staff present for nuc med inj. Pt tol well, no additional sedation required. See doc flowsheets.

## 2014-08-03 NOTE — Discharge Instructions (Signed)
[facility] °Office Phone Number [phone] ° °BREAST BIOPSY/ PARTIAL MASTECTOMY: POST OP INSTRUCTIONS ° °Always review your discharge instruction sheet given to you by the facility where your surgery was performed. ° °IF YOU HAVE DISABILITY OR FAMILY LEAVE FORMS, YOU MUST BRING THEM TO THE OFFICE FOR PROCESSING.  DO NOT GIVE THEM TO YOUR DOCTOR. ° °1. A prescription for pain medication may be given to you upon discharge.  Take your pain medication as prescribed, if needed.  If narcotic pain medicine is not needed, then you may take acetaminophen (Tylenol) or ibuprofen (Advil) as needed. °2. Take your usually prescribed medications unless otherwise directed °3. If you need a refill on your pain medication, please contact your pharmacy.  They will contact our office to request authorization.  Prescriptions will not be filled after 5pm or on week-ends. °4. You should eat very light the first 24 hours after surgery, such as soup, crackers, pudding, etc.  Resume your normal diet the day after surgery. °5. Most patients will experience some swelling and bruising in the breast.  Ice packs and a good support bra will help.  Swelling and bruising can take several days to resolve.  °6. It is common to experience some constipation if taking pain medication after surgery.  Increasing fluid intake and taking a stool softener will usually help or prevent this problem from occurring.  A mild laxative (Milk of Magnesia or Miralax) should be taken according to package directions if there are no bowel movements after 48 hours. °7. Unless discharge instructions indicate otherwise, you may remove your bandages 24-48 hours after surgery, and you may shower at that time.  You may have steri-strips (small skin tapes) in place directly over the incision.  These strips should be left on the skin for 7-10 days.  If your surgeon used skin glue on the incision, you may shower in 24 hours.  The glue will flake off over the  next 2-3 weeks.  Any sutures or staples will be removed at the office during your follow-up visit. °8. ACTIVITIES:  You may resume regular daily activities (gradually increasing) beginning the next day.  Wearing a good support bra or sports bra minimizes pain and swelling.  You may have sexual intercourse when it is comfortable. °a. You may drive when you no longer are taking prescription pain medication, you can comfortably wear a seatbelt, and you can safely maneuver your car and apply brakes. °b. RETURN TO WORK:  ______________________________________________________________________________________ °9. You should see your doctor in the office for a follow-up appointment approximately two weeks after your surgery.  Your doctor’s nurse will typically make your follow-up appointment when she calls you with your pathology report.  Expect your pathology report 2-3 business days after your surgery.  You may call to check if you do not hear from us after three days. °10. OTHER INSTRUCTIONS: _______________________________________________________________________________________________ _____________________________________________________________________________________________________________________________________ °_____________________________________________________________________________________________________________________________________ °_____________________________________________________________________________________________________________________________________ ° °WHEN TO CALL YOUR DOCTOR: °1. Fever over 101.0 °2. Nausea and/or vomiting. °3. Extreme swelling or bruising. °4. Continued bleeding from incision. °5. Increased pain, redness, or drainage from the incision. ° °The clinic staff is available to answer your questions during regular business hours.  Please don’t hesitate to call and ask to speak to one of the nurses for clinical concerns.  If you have a medical emergency, go to the nearest  emergency room or call 911.  A surgeon from [facility] is always on call at the hospital. ° °For further questions, please visit [email or web address]  ° ° °  Post Anesthesia Home Care Instructions ° °Activity: °Get plenty of rest for the remainder of the day. A responsible adult should stay with you for 24 hours following the procedure.  °For the next 24 hours, DO NOT: °-Drive a car °-Operate machinery °-Drink alcoholic beverages °-Take any medication unless instructed by your physician °-Make any legal decisions or sign important papers. ° °Meals: °Start with liquid foods such as gelatin or soup. Progress to regular foods as tolerated. Avoid greasy, spicy, heavy foods. If nausea and/or vomiting occur, drink only clear liquids until the nausea and/or vomiting subsides. Call your physician if vomiting continues. ° °Special Instructions/Symptoms: °Your throat may feel dry or sore from the anesthesia or the breathing tube placed in your throat during surgery. If this causes discomfort, gargle with warm salt water. The discomfort should disappear within 24 hours. ° °

## 2014-08-03 NOTE — Progress Notes (Signed)
Assisted Dr. Massagee with right, ultrasound guided, pectoralis block. Side rails up, monitors on throughout procedure. See vital signs in flow sheet. Tolerated Procedure well. 

## 2014-08-03 NOTE — Anesthesia Procedure Notes (Addendum)
Anesthesia Regional Block:  Pectoralis block  Pre-Anesthetic Checklist: ,, timeout performed, Correct Patient, Correct Site, Correct Laterality, Correct Procedure, Correct Position, site marked, Risks and benefits discussed,  Surgical consent,  Pre-op evaluation,  At surgeon's request and post-op pain management  Laterality: Right and Upper  Prep: chloraprep       Needles:   Needle Type: Echogenic Needle     Needle Length: 9cm 9 cm Needle Gauge: 21 and 21 G  Needle insertion depth: 5 cm   Additional Needles:  Procedures: ultrasound guided (picture in chart) Pectoralis block Narrative:  Start time: 08/03/2014 1:35 PM End time: 08/03/2014 1:50 PM Injection made incrementally with aspirations every 5 mL.  Performed by: Personally   Additional Notes: Tolerated well   Procedure Name: LMA Insertion Date/Time: 08/03/2014 2:52 PM Performed by: Lyndee Leo Pre-anesthesia Checklist: Patient identified, Emergency Drugs available, Suction available and Patient being monitored Patient Re-evaluated:Patient Re-evaluated prior to inductionOxygen Delivery Method: Circle System Utilized Preoxygenation: Pre-oxygenation with 100% oxygen Intubation Type: IV induction Ventilation: Mask ventilation without difficulty LMA: LMA inserted LMA Size: 4.0 Number of attempts: 1 Airway Equipment and Method: bite block Placement Confirmation: positive ETCO2 Tube secured with: Tape Dental Injury: Teeth and Oropharynx as per pre-operative assessment

## 2014-08-03 NOTE — Progress Notes (Addendum)
Radiation Oncology         [phone] ________________________________  Name: Betty Foster MRN: [id number]  Date: 08/01/2014  DOB: Feb 16, 1966  QA:[id number] T, MD  Coralie Keens, MD     REFERRING PHYSICIAN: Coralie Keens, MD   DIAGNOSIS: The encounter diagnosis was Breast cancer of upper-outer quadrant of right female breast.   HISTORY OF PRESENT ILLNESS::Betty Foster is a 48 y.o. female who is seen for an initial consultation visit regarding the patient's diagnosis of breast cancer.  The patient was found to have suspicious findings within the right breast on initial mammogram. The patient has not had symptoms prior to this study. A diagnostic mammogram and breast ultrasound confirmed this finding. On ultrasound, the tumor measured 0.8 cm and was present in the upper outer quadrant.  A biopsy was performed. 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 The tumor was felt to be most consistent with lobular phenotype. Receptors studies were completed and indicate that the tumor is estrogen receptor positive, progesterone receptor positive, and Her-2/neu negative. %.   The patient has undergone an MRI scan of the breasts: this revealed a small spiculated 1.57 m mass within the right upper outer quadrant consistent with the known malignancy. No suspicious adenopathy or other breast lesion. Multiple small hepatic lesions were noted on this exam. This led to a MRI scan of the liver which did not reveal any evidence for metastatic disease.    PREVIOUS RADIATION THERAPY: No   PAST MEDICAL HISTORY:  has a past medical history of Breast cancer (06/2014) and Hyperlipemia.     PAST SURGICAL HISTORY: Past Surgical History  Procedure Laterality Date  . No past surgeries       FAMILY HISTORY: family history includes Cancer (age of onset: 53) in her maternal grandmother; Cancer (age of onset: 67) in her maternal aunt; Cancer (age of onset: 43) in her father  and paternal aunt; Cancer (age of onset: 40) in her maternal grandfather; Cancer (age of onset: 42) in her paternal uncle.   SOCIAL HISTORY:  reports that she has never smoked. She has never used smokeless tobacco. She reports that she does not drink alcohol or use illicit drugs.   ALLERGIES: Review of patient's allergies indicates no known allergies.   MEDICATIONS:  No current facility-administered medications for this encounter.   Current Outpatient Prescriptions  Medication Sig Dispense Refill  . HYDROcodone-acetaminophen (NORCO) 5-325 MG per tablet Take 1-2 tablets by mouth every 4 (four) hours as needed. 40 tablet 0   Facility-Administered Medications Ordered in Other Encounters  Medication Dose Route Frequency Provider Last Rate Last Dose  . fentaNYL (SUBLIMAZE) injection 50-100 mcg  50-100 mcg Intravenous PRN Napoleon Form, MD   75 mcg at 08/03/14 1340  . HYDROmorphone (DILAUDID) injection 0.25-0.5 mg  0.25-0.5 mg Intravenous Q5 min PRN Rica Koyanagi, MD   0.5 mg at 08/03/14 1641  . lactated ringers infusion   Intravenous Continuous Napoleon Form, MD 10 mL/hr at 08/03/14 1642    . midazolam (VERSED) injection 1-2 mg  1-2 mg Intravenous PRN Napoleon Form, MD   1.5 mg at 08/03/14 1340  . oxyCODONE (Oxy IR/ROXICODONE) immediate release tablet 5 mg  5 mg Oral Once PRN Rica Koyanagi, MD       Or  . oxyCODONE (ROXICODONE) 5 MG/5ML solution 5 mg  5 mg Oral Once PRN Rica Koyanagi, MD      . promethazine (PHENERGAN) injection 6.25-12.5 mg  6.25-12.5 mg Intravenous Q15  min PRN Rica Koyanagi, MD         REVIEW OF SYSTEMS:  A 15 point review of systems is documented in the electronic medical record. This was obtained by the nursing staff. However, I reviewed this with the patient to discuss relevant findings and make appropriate changes.  Pertinent items are noted in HPI.    PHYSICAL EXAM:  height is '5\' 2"'  (1.575 m) and weight is 120 lb 12.8 oz (54.795 kg). Her oral temperature is 98.7  F (37.1 C). Her blood pressure is 139/87 and her pulse is 67. Her respiration is 20.   ECOG = 1  0 - Asymptomatic (Fully active, able to carry on all predisease activities without restriction)  1 - Symptomatic but completely ambulatory (Restricted in physically strenuous activity but ambulatory and able to carry out work of a light or sedentary nature. For example, light housework, office work)  2 - Symptomatic, <50% in bed during the day (Ambulatory and capable of all self care but unable to carry out any work activities. Up and about more than 50% of waking hours)  3 - Symptomatic, >50% in bed, but not bedbound (Capable of only limited self-care, confined to bed or chair 50% or more of waking hours)  4 - Bedbound (Completely disabled. Cannot carry on any self-care. Totally confined to bed or chair)  5 - Death   Eustace Pen MM, Creech RH, Tormey DC, et al. [phone]). "Toxicity and response criteria of the [facility]". [place] Oncol. 5 (6): 649-55  General: Well-developed, in no acute distress HEENT: Normocephalic, atraumatic; oral cavity clear Neck: Supple without any lymphadenopathy Cardiovascular: Regular rate and rhythm Respiratory: Clear to auscultation bilaterally Breasts: palpable mass in the right axillary tail region. No suspicious lymphadenopathy on the right. No breast masses within the left breast and no axillary adenopathy on this side GI: Soft, nontender, normal bowel sounds Extremities: No edema present Neuro: No focal deficits     LABORATORY DATA:  Lab Results  Component Value Date   HGB 13.6 08/03/2014   No results found for: NA, K, CL, CO2 No results found for: ALT, AST, GGT, ALKPHOS, BILITOT    RADIOGRAPHY: Mr Liver W Wo Contrast  07/26/2014   CLINICAL DATA:  Liver lesions on prior breast MRI. Breast cancer.Breast cancer of upper-outer quadrant of right female breast C50.411 (ICD-10-CM).  EXAM: MRI ABDOMEN WITHOUT AND WITH CONTRAST   TECHNIQUE: Multiplanar multisequence MR imaging of the abdomen was performed both before and after the administration of intravenous contrast.  CONTRAST:  97m MULTIHANCE GADOBENATE DIMEGLUMINE 529 MG/ML IV SOLN  COMPARISON:  Breast MRI of 06/1914  FINDINGS: Lower chest: Normal heart size without pericardial or pleural effusion.  Hepatobiliary: 3 liver lesions which are most consistent with hemangiomas. Posterior segment right liver lobe lesion, immediately posterior to the right hepatic vein. 1.1 cm on image 17 of series 3. Just cephalad and lateral to this is a 1.7 cm lesion on image 14 of series 3. These lesions both demonstrate T2 hyperintensity and early peripheral nodular enhancement.  Smaller lesion within the more lateral right liver lobe (likely the anterior segment) measures on the order of 7 mm on image 15 of series 3. Demonstrates delayed hyper enhancement.  No suspicious liver lesion. Normal gallbladder, without biliary ductal dilatation.  Pancreas: Normal, without mass or pancreatic ductal dilatation.  Spleen: 5 mm T2 hyperintense focus within the anterior spleen is likely a cyst or tiny lymphangioma. Of no clinical significance.  Adrenals/Urinary  Tract: Normal adrenal glands. Normal kidneys, without hydronephrosis.  Stomach/Bowel: Normal stomach and small bowel loops. Colonic stool burden suggests constipation.  Vascular/Lymphatic: Normal aorta and branch vessels. No abdominal adenopathy.  Other:  No ascites.  Musculoskeletal: No focal osseous abnormality.  IMPRESSION: 1. Three right liver lobe hemangiomas.  No suspicious liver lesion. 2. No evidence of metastatic disease or acute process within the abdomen. 3.  Possible constipation.   Electronically Signed   By: Abigail Miyamoto M.D.   On: 07/26/2014 15:16   Mr Breast Bilateral W Wo Contrast  07/17/2014   CLINICAL DATA:  Infiltrating lobular carcinoma of the right breast following core needle biopsy diagnosed in [place] on 07/02/2014.  Patient has a family history positive for breast cancer in a maternal aunt diagnosed between the ages of 77 and 71.  EXAM: BILATERAL BREAST MRI WITH AND WITHOUT CONTRAST  TECHNIQUE: Multiplanar, multisequence MR images of both breasts were obtained prior to and following the intravenous administration of 4m of MultiHance.  THREE-DIMENSIONAL MR IMAGE RENDERING ON INDEPENDENT WORKSTATION:  Three-dimensional MR images were rendered by post-processing of the original MR data on an independent workstation. The three-dimensional MR images were interpreted, and findings are reported in the following complete MRI report for this study. Three dimensional images were evaluated at the independent DynaCad workstation  [facility]  [facility]  FINDINGS: Breast composition: c:  Heterogeneous fibroglandular tissue  Background parenchymal enhancement: Moderate  Right breast: Within the right axillary tail there is an enhancing spiculated mass which measures 1.5 x 1.5 x 1.1 cm. This demonstrates rapid wash-in and washout type enhancement kinetics. No signal void artifact to indicate presence of a clip within the mass. No other suspicious enhancement is identified within the right breast.  Left breast: No mass or abnormal enhancement.  Lymph nodes: No abnormal appearing lymph nodes.  Ancillary findings: Within the right hepatic lobe there is a T2 bright lesion measuring 2.0 cm. This demonstrates typical nodular wash-in enhancement pattern consistent with hemangioma. Just lateral to this lesion there is a smaller 0.8 cm lesion likely also representing hemangioma. Posterior to the right hepatic vein there is a 1.0 cm T2 bright lesion not well evaluated on postcontrast images. Although favored to represent benign lesion this lesion is not fully characterized.  IMPRESSION: 1. Small spiculated mass measure 1.5 cm within the right  axillary tail consistent with known malignancy. No definite clip artifact identified within the mass. 2. No suspicious adenopathy or other breast lesion identified. 3. Multiple small hepatic lesions favored to be benign but not completely characterized. Full characterization with abdominal MRI is recommended.  RECOMMENDATION: 1. Treatment plan regarding known right breast cancer. 2. Abdominal MRI to characterize liver lesions.  BI-RADS CATEGORY  6: Known biopsy-proven malignancy.   Electronically Signed   By: BShon HaleM.D.   On: 07/17/2014 12:35   Mm Breast Surgical Specimen  08/03/2014   CLINICAL DATA:  48year old female with right breast carcinoma- evaluate lumpectomy specimen  EXAM: SPECIMEN RADIOGRAPH OF THE RIGHT BREAST  COMPARISON:  Previous exam(s)  FINDINGS: Status post excision of the right breast. The wire tip is present and marked for pathology. The mass is faint but identified.  IMPRESSION: Specimen radiograph of the right breast.   Electronically Signed   By: JHassan RowanM.D.   On: 08/03/2014 15:50   [place] Breast Loc Dev   1st Lesion  Inc [place]  08/03/2014   CLINICAL DATA:  48 year old female with right breast cancer. Wire localization prior to right lumpectomy.  EXAM: NEEDLE LOCALIZATION OF THE RIGHT BREAST WITH ULTRASOUND GUIDANCE  COMPARISON:  Previous exams.  FINDINGS: Patient presents for needle localization prior to right lumpectomy. I met with the patient and we discussed the procedure of needle localization including benefits and alternatives. We discussed the high likelihood of a successful procedure. We discussed the risks of the procedure, including infection, bleeding, tissue injury, and further surgery. Informed, written consent was given. The usual time-out protocol was performed immediately prior to the procedure.  Using ultrasound guidance, sterile technique, 2% lidocaine and a 7 cm modified Kopans needle, the mass in the right axillary tail localized using a lateral  approach. A skin mark was placed directly over the mass. The films were marked for Dr. Ninfa Linden.  IMPRESSION: Needle localization right breast. No apparent complications.   Electronically Signed   By: Hassan Rowan M.D.   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 She appears to be a good candidate for breast conservation treatment.  I discussed with the patient the role of adjuvant radiation treatment in this setting. We discussed the potential benefit of radiation treatment, especially with regards to local control of the patient's tumor. We also discussed the possible side effects and risks of such a treatment as well.  All of the patient's questions were answered. The patient wishes to proceed with radiation treatment at the appropriate time.  PLAN: I look forward to seeing the patient postoperatively to review her case and further discuss and coordinate an anticipated course of radiation treatment. The patient wishes to be treated in [place]. This certainly can be accomplished and I discussed this with the patient. I will see her today her postoperatively after she has seen medical oncology.   I spent 60 minutes face to face with the patient and more than 50% of that time was spent in counseling and/or coordination of care.    ________________________________   Jodelle Gross, MD, PhD   **Disclaimer: This note was dictated with  voice recognition software. Similar sounding words can inadvertently be transcribed and this note may contain transcription errors which may not have been corrected upon publication of note.**

## 2014-08-03 NOTE — Addendum Note (Signed)
Encounter addended by: Marye Round, MD on: 08/03/2014  4:57 PM<BR>     Documentation filed: Notes Section

## 2014-08-03 NOTE — Transfer of Care (Signed)
Immediate Anesthesia Transfer of Care Note  Patient: Betty Foster  Procedure(s) Performed: Procedure(s): NEEDLE LOCALIZED RIGHT BREAST LUMPECTOMY AND RIGHT  SENTINEL LYMPH NODE BX (Right)  Patient Location: PACU  Anesthesia Type:GA combined with regional for post-op pain  Level of Consciousness: awake, sedated and patient cooperative  Airway & Oxygen Therapy: Patient Spontanous Breathing and Patient connected to face mask oxygen  Post-op Assessment: Report given to PACU RN and Post -op Vital signs reviewed and stable  Post vital signs: Reviewed and stable  Complications: No apparent anesthesia complications

## 2014-08-03 NOTE — Interval H&P Note (Signed)
History and Physical Interval Note: no change in H and P  08/03/2014 2:21 PM  [facility]  has presented today for surgery, with the diagnosis of Right Breast Cancer  The various methods of treatment have been discussed with the patient and family. After consideration of risks, benefits and other options for treatment, the patient has consented to  Procedure(s): NEEDLE LOCALIZED RIGHT BREAST LUMPECTOMY AND RIGHT  SENTINEL LYMPH NODE BX (Right) as a surgical intervention .  The patient's history has been reviewed, patient examined, no change in status, stable for surgery.  I have reviewed the patient's chart and labs.  Questions were answered to the patient's satisfaction.     Jestina Stephani A

## 2014-08-03 NOTE — Anesthesia Postprocedure Evaluation (Signed)
  Anesthesia Post-op Note  Patient: Betty Foster  Procedure(s) Performed: Procedure(s): NEEDLE LOCALIZED RIGHT BREAST LUMPECTOMY AND RIGHT  SENTINEL LYMPH NODE BX (Right)  Patient Location: PACU  Anesthesia Type:General  Level of Consciousness: awake and sedated  Airway and Oxygen Therapy: Patient Spontanous Breathing  Post-op Pain: mild  Post-op Assessment: Post-op Vital signs reviewed  Post-op Vital Signs: stable  Last Vitals:  Filed Vitals:   08/03/14 1700  BP: 148/85  Pulse: 66  Temp:   Resp: 8    Complications: No apparent anesthesia complications

## 2014-08-04 NOTE — Op Note (Signed)
[name], Foster NO.:  [id number]  MEDICAL RECORD NO.:  [id number]  LOCATION:  [facility]                          FACILITY:  [facility]  PHYSICIAN:  Betty Foster, M.D. DATE OF BIRTH:  Feb 09, 1966  DATE OF PROCEDURE:  08/03/2014 DATE OF DISCHARGE:                              OPERATIVE REPORT   PREOPERATIVE DIAGNOSIS:  Right breast cancer.  POSTOPERATIVE DIAGNOSIS:  Right breast cancer.  PROCEDURE: 1. Needle-localized right breast partial mastectomy and right sentinel     lymph node biopsy. 2. Injection of blue dye.  SURGEON:  Betty Foster, M.D.  ANESTHESIA:  General and 0.5% Marcaine.  ESTIMATED BLOOD LOSS:  Minimal.  INDICATIONS:  This is a 48 year old female who was found on mammography to have a 0.8 cm mass in the upper outer quadrant of the right breast. Stereotactic biopsy showed invasive lobular carcinoma.  Decision was made to proceed with partial mastectomy under needle localization.  FINDINGS:  The patient was found to have 1 sentinel lymph node, but 2 other nodes were removed that were adjacent to this.  The large partial mastectomy incision was excised.  By the radiologist's report who placed the wire he had completely speared the mass with the wire.  On postoperative x-ray, I could visualize what appeared to be the mass right where he said it was, but the radiologist who had not placed the wire reading the film was not as certain.  The specimen was marked with a marker paint and sent to Pathology for evaluation.  PROCEDURE IN DETAIL:  The patient was brought to the operating room, identified as Betty Foster.  She had already had a localization wire placed in the right upper quadrant of the right breast and had injection of radioactive isotope into the right areola.  She was taken to the operating room and placed on the operating table in supine position and general anesthesia was induced.  Her right breast and chest were then prepped and  draped in the usual sterile fashion after I injected blue dye underneath the areola and massaged the breast.  The localization wire was in the upper outer quadrant and X had been marked on the breast at the area of the mass.  I made an elliptical incision on the breast overlying the X and took this down into the breast tissue.  The localization wire was easily identified and brought into the incision. I then performed a wide partial mastectomy including all the upper outer quadrant of the breast.  This went all way down to the chest wall and the edge of the pectoralis muscle.  The wide local excision was then completely performed with wide margins around the wire.  I then marked this with marker paint.  X-ray was then performed, it appeared that the mass was in the specimen, although the radiologist was not quite certain.  I took no further tissue as she was quite small breasted and this appeared to be exactly the area of concern with the previous report that the wire had completely gone through the mass.  The mass was sent to Pathology for evaluation.  As the incision was in the upper outer quadrant, I then  brought the Neoprobe on the field and could easily identify a sentinel lymph node that contained blue dye.  While excising this lymph node, there were 2 lymph nodes adjacent to this, which I removed in mass.  After this was sent to Pathology for evaluation, I again evaluated the nodal basin and found no other increased uptake of radioactive isotope.  I then irrigated the wound with saline. Hemostasis appeared to be achieved with the cautery.  I placed surgical clips around the biopsy cavity.  I closed the subcutaneous tissue with interrupted 3-0 Vicryl sutures and closed the skin with a running 4-0 Monocryl.  Steri-Strips, gauze, and Tegaderm were then applied.  The patient tolerated the procedure well.  All the counts were correct at the end of procedure.  The patient was then  extubated in the operating room and taken in a stable condition to the recovery room.     Betty Foster, M.D.     DB/MEDQ  D:  08/03/2014  T:  08/04/2014  Job:  [id number]

## 2014-08-06 ENCOUNTER — Encounter (HOSPITAL_BASED_OUTPATIENT_CLINIC_OR_DEPARTMENT_OTHER): Payer: Self-pay | Admitting: Surgery

## 2014-08-10 ENCOUNTER — Encounter: Payer: Self-pay | Admitting: *Deleted

## 2014-08-10 ENCOUNTER — Ambulatory Visit (HOSPITAL_BASED_OUTPATIENT_CLINIC_OR_DEPARTMENT_OTHER): Payer: PRIVATE HEALTH INSURANCE | Admitting: Hematology and Oncology

## 2014-08-10 ENCOUNTER — Telehealth: Payer: Self-pay | Admitting: Hematology and Oncology

## 2014-08-10 VITALS — BP 142/91 | HR 68 | Temp 98.3°F | Resp 18 | Ht 62.0 in | Wt 123.2 lb

## 2014-08-10 DIAGNOSIS — Z170 Estrogen receptor positive status [ER+]: Secondary | ICD-10-CM

## 2014-08-10 DIAGNOSIS — C50411 Malignant neoplasm of upper-outer quadrant of right female breast: Secondary | ICD-10-CM

## 2014-08-10 NOTE — Telephone Encounter (Signed)
gv pt appt schedule for dec.  °

## 2014-08-10 NOTE — Progress Notes (Signed)
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 N0 M0 stage IA    CHIEF COMPLIANT: followup after surgery  INTERVAL HISTORY: Betty Foster is a 48 year old Caucasian with above-mentioned history of right-sided invasive lobular cancer she underwent recent lumpectomy and is here today to discuss results. She is doing very well without any major problems or concerns. She is recovering very well from surgery. She is accompanied by her daughter. She had BRCA testing which were both negative. She is here to discuss final pathology report and to discuss further treatment plan.  REVIEW OF SYSTEMS:   Constitutional: Denies fevers, chills or abnormal weight loss Eyes: Denies blurriness of vision Ears, nose, mouth, throat, and face: Denies mucositis or sore throat Respiratory: Denies cough, dyspnea or wheezes Cardiovascular: Denies palpitation, chest discomfort or lower extremity swelling Gastrointestinal:  Denies nausea, heartburn or change in bowel habits Skin: Denies abnormal skin rashes Lymphatics: Denies new lymphadenopathy or easy bruising Neurological:Denies numbness, tingling or new weaknesses Behavioral/Psych: Mood is stable, no new changes  Breast:slightly sore from recent surgery. All other  systems were reviewed with the patient and are negative.  I have reviewed the past medical history, past surgical history, social history and family history with the patient and they are unchanged from previous note.  ALLERGIES:  has No Known Allergies.  MEDICATIONS:  Current Outpatient Prescriptions  Medication Sig Dispense Refill  . atorvastatin (LIPITOR) 20 MG tablet Take 20 mg by mouth daily.    Marland Kitchen HYDROcodone-acetaminophen (NORCO) 5-325 MG per tablet Take 1-2 tablets by mouth every 4 (four) hours as needed. 40 tablet 0   No current facility-administered medications for this visit.    PHYSICAL EXAMINATION: ECOG PERFORMANCE STATUS: 0 - Asymptomatic  Filed Vitals:   08/10/14 1028  BP: 142/91  Pulse: 68  Temp: 98.3 F (36.8 C)  Resp: 18   Filed Weights   08/10/14 1028  Weight: 123 lb 3.2 oz (55.883 kg)    GENERAL:alert, no distress and comfortable SKIN: skin color, texture, turgor are normal, no rashes or significant lesions EYES: normal, Conjunctiva are pink and non-injected, sclera clear OROPHARYNX:no exudate, no erythema and lips, buccal mucosa, and tongue normal  NECK: supple, thyroid normal size, non-tender, without nodularity LYMPH:  no palpable lymphadenopathy in the cervical, axillary or inguinal LUNGS: clear to auscultation and percussion with normal breathing effort HEART: regular rate & rhythm and no murmurs and no lower extremity edema ABDOMEN:abdomen soft, non-tender and normal bowel sounds Musculoskeletal:no cyanosis of digits and no clubbing  NEURO: alert & oriented x 3 with fluent speech, no focal motor/sensory deficits  LABORATORY DATA:  I have reviewed the data as listed   Chemistry   No results found for: NA, K, CL, CO2, BUN, CREATININE, GLU No results found for: CALCIUM, ALKPHOS, AST,  ALT, BILITOT     Lab Results  Component Value Date   HGB 13.6 08/03/2014    ASSESSMENT & PLAN:  Breast cancer of upper-outer quadrant of right female  breast Right breast invasive lobular cancer grade 2, 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 N0 M0 stage IA Pathology counseling: I discussed the results of the final pathology including the pathologic staging and I recommended sending him for Oncotype DX testing to determine prognosis as well as prediction for chemotherapy benefit. Return to clinic in about 2 and half weeks to review the Oncotype DX testing to determine further treatment plan. If she does not need chemotherapy, she will been on for receive radiation therapy followed by antiestrogen therapy.    No orders of the defined types were placed in this encounter.   The patient has a good understanding of the overall plan. she agrees with it. She will call with any problems that may develop before her next visit here.  I spent 15 minutes counseling the patient face to face. The total time spent in the appointment was 20 minutes and more than 50% was on counseling and review of test results    Rulon Eisenmenger, MD 08/10/2014 11:12 AM

## 2014-08-10 NOTE — Progress Notes (Signed)
Ordered oncotype per Dr. Lindi Adie.  Faxed requisition to pathology and confirmed receipt with Tammy.

## 2014-08-10 NOTE — Assessment & Plan Note (Signed)
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 N0 M0 stage IA Pathology counseling: I discussed the results of the final pathology including the pathologic staging and I recommended sending him for Oncotype DX testing to determine prognosis as well as prediction for chemotherapy benefit. Return to clinic in about 2 and half weeks to review the Oncotype DX testing to determine further treatment plan. If she does not need chemotherapy, she will been on for receive radiation therapy followed by antiestrogen therapy.

## 2014-08-20 ENCOUNTER — Encounter (HOSPITAL_COMMUNITY): Payer: Self-pay

## 2014-08-20 ENCOUNTER — Encounter: Payer: Self-pay | Admitting: *Deleted

## 2014-08-20 NOTE — Progress Notes (Signed)
Received Oncotype Dx results of 20.  Gave copy to Dr. Lindi Adie and took a copy to HIM to scan.

## 2014-08-27 ENCOUNTER — Telehealth: Payer: Self-pay | Admitting: *Deleted

## 2014-08-27 NOTE — Telephone Encounter (Signed)
Called patient to alter Wednesday's appt. For Dr. Lisbeth Renshaw, per Dr. Lisbeth Renshaw, patient's husband Elta [place]) agreed for Dr. Lisbeth Renshaw to see her @ 4:30 pm on 08-29-14, notified Dr. Lisbeth Renshaw.

## 2014-08-28 ENCOUNTER — Ambulatory Visit: Payer: PRIVATE HEALTH INSURANCE | Admitting: Hematology and Oncology

## 2014-08-28 ENCOUNTER — Telehealth: Payer: Self-pay | Admitting: Hematology and Oncology

## 2014-08-28 NOTE — Progress Notes (Signed)
Follow up New Consult  Seen 08/01/2014 Right Breast upper outer quadrant   Histology per Pathology Report: 07/02/2014: U/S biopsy=Infiltrating lobular carcinoma (0/0) nodes examined, 2-3c  07/25/14 right breast biopsy= Invasive mammary Carcinoma,appears grade II   Receptor Status: ER( 3+ ), PR ( 3+ ), Her2-neu ( neg )  Did patient present with symptoms (if so, please note symptoms) or was this found on screening mammography?: tenderness in right breast but for regular screening mammogram 06/26/14  Past/Anticipated interventions by surgeon, if any: Diagnosis 08/03/14: 1. Breast, lumpectomy, right- INVASIVE LOBULAR CARCINOMA, GRADE 2, SPANNING 1.5 CM.- FAT NECROSIS.- FIBROCYSTIC CHANGE.- RESECTION MARGINS ARE NEGATIVE.- SEE ONCOLOGY TABLE.2. Lymph node, sentinel, biopsy, right axillary- ONE OF ONE LYMPH NODE NEGATIVE FOR CARCINOMA (0/1).3. Lymph node, sentinel, biopsy, right axillary- ONE OF ONE LYMPH NODE NEGATIVE FOR CARCINOMA (0/1). Dr. BlackmanDouglas, MD LAST WEDNESDAY  130cc fluid drained from right breast, Friday before that fluid aspirated off right breast,  With Dr. Toth, appt with Dr. Blackman this Friday Dec 12/2013  Past/Anticipated interventions by medical oncology, if any: Chemotherapy : saw Dr. Gudena 07/20/14, follow up 08/10/14 and seen by MD, , patient to have Genetic testing for BRCA1&BRCA2 genes sent 07/19/14 for analysis results normal=did not reveal a deleterious mutation in either of thes genes,07/31/14 note from Catherine Fine ,folllow up with Dr. Gudena 08/29/14  Lymphedema issues, if any: no  Pain issues, if any:discomfort right  Breat, swelling  And knot at incision site, is well healed incision  SAFETY ISSUES:  Prior radiation? No  Pacemaker/ICD? No  Possible current pregnancy? No  Is the patient on methotrexate? No 

## 2014-08-28 NOTE — Telephone Encounter (Signed)
returned pt call at work and lvm confi appt d.t

## 2014-08-29 ENCOUNTER — Encounter: Payer: Self-pay | Admitting: Radiation Oncology

## 2014-08-29 ENCOUNTER — Telehealth: Payer: Self-pay | Admitting: Hematology and Oncology

## 2014-08-29 ENCOUNTER — Ambulatory Visit
Admission: RE | Admit: 2014-08-29 | Discharge: 2014-08-29 | Disposition: A | Discharge status: Home / Self Care | Payer: PRIVATE HEALTH INSURANCE | Source: Ambulatory Visit | Attending: Radiation Oncology | Admitting: Radiation Oncology

## 2014-08-29 ENCOUNTER — Ambulatory Visit (HOSPITAL_BASED_OUTPATIENT_CLINIC_OR_DEPARTMENT_OTHER): Payer: PRIVATE HEALTH INSURANCE | Admitting: Hematology and Oncology

## 2014-08-29 VITALS — BP 147/82 | HR 62 | Temp 98.2°F | Resp 20 | Ht 62.0 in | Wt 120.3 lb

## 2014-08-29 VITALS — BP 139/91 | HR 61 | Temp 98.2°F | Resp 18 | Ht 62.0 in | Wt 119.7 lb

## 2014-08-29 DIAGNOSIS — C50411 Malignant neoplasm of upper-outer quadrant of right female breast: Secondary | ICD-10-CM

## 2014-08-29 DIAGNOSIS — C50611 Malignant neoplasm of axillary tail of right female breast: Secondary | ICD-10-CM

## 2014-08-29 DIAGNOSIS — Z510 Encounter for antineoplastic radiation therapy: Secondary | ICD-10-CM | POA: Diagnosis not present

## 2014-08-29 MED ORDER — TAMOXIFEN CITRATE 20 MG PO TABS: 20.0000 mg | ORAL_TABLET | Freq: Every day | ORAL | Status: DC

## 2014-08-29 NOTE — Progress Notes (Signed)
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 N0 M0 stage IA    CHIEF COMPLIANT: followup to discuss Oncotype DX  INTERVAL HISTORY: Betty Foster is a 48 year old Caucasian with above-mentioned history of right-sided breast cancer. She was treated with lumpectomy and it showed a stage IA breast cancer that was ER/PR positive HER-2 negative. We sent for Oncotype DX testing and it came back as recurrence score of 20 which the intermediate risk category and she is here today accompanied by her husband to discuss the treatment plan. Patient continues to have problems with seroma accumulated lots of fluid she had to be drained twice already.she has an appointment to see radiation oncology today.  REVIEW OF SYSTEMS:   Constitutional: Denies fevers, chills or abnormal weight loss Eyes: Denies blurriness of vision Ears, nose, mouth, throat, and face: Denies mucositis or sore throat Respiratory: Denies cough, dyspnea or wheezes Cardiovascular: Denies palpitation, chest discomfort or lower extremity swelling Gastrointestinal:  Denies nausea, heartburn or change in bowel habits Skin: Denies abnormal skin rashes Lymphatics: Denies new lymphadenopathy or easy bruising Neurological:Denies numbness,  tingling or new weaknesses Behavioral/Psych: Mood is stable, no new changes  Breast: fluid buildup at the surgery site All other systems were reviewed with the patient and are negative.  I have reviewed the past medical history, past surgical history, social history and family history with the patient and they are unchanged from previous note.  ALLERGIES:  has No Known Allergies.  MEDICATIONS:  Current Outpatient Prescriptions  Medication Sig Dispense Refill  . atorvastatin (LIPITOR) 20 MG tablet Take 20 mg by mouth daily.    Marland Kitchen HYDROcodone-acetaminophen (NORCO) 5-325 MG per tablet Take 1-2 tablets by mouth every 4 (four) hours as needed. 40 tablet 0  . tamoxifen (NOLVADEX) 20 MG tablet Take 1 tablet (20 mg total) by mouth daily. 90 tablet 3   No current facility-administered medications for this visit.    PHYSICAL EXAMINATION: ECOG PERFORMANCE STATUS: 1 - Symptomatic but completely ambulatory  Filed Vitals:   08/29/14 1342  BP: 139/91  Pulse: 61  Temp: 98.2 F (36.8 C)  Resp: 18   Filed Weights   08/29/14 1342  Weight: 119 lb 11.2 oz (54.296 kg)    GENERAL:alert, no distress and comfortable SKIN: skin color, texture, turgor are normal, no rashes or significant lesions EYES: normal, Conjunctiva are pink and non-injected, sclera clear OROPHARYNX:no exudate, no erythema and lips, buccal mucosa, and tongue normal  NECK: supple, thyroid normal size, non-tender, without nodularity LYMPH:  no palpable lymphadenopathy in the cervical, axillary or inguinal LUNGS: clear to auscultation and percussion with normal breathing effort HEART: regular rate & rhythm and no murmurs and no lower extremity edema ABDOMEN:abdomen soft, non-tender and normal bowel sounds Musculoskeletal:no cyanosis of digits and no clubbing  NEURO: alert & oriented x 3 with fluent speech, no focal motor/sensory deficits BREAST seroma at the surgery site  LABORATORY DATA:  I have reviewed the data as listed    Chemistry   No results found for: NA, K, CL, CO2, BUN, CREATININE, GLU No results found for: CALCIUM, ALKPHOS, AST, ALT, BILITOT     Lab Results  Component Value Date   HGB 13.6 08/03/2014   ASSESSMENT & PLAN:  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 N0 M0 stage IA; Oncotype DX recurrence score 20, intermediate risk ROR13%  Oncotype DX recurrence score results based upon the recurrence risk of 20, I did not recommend adjuvant systemic chemotherapy based on the clinical profile. I recommended adjuvant radiation therapy followed by antiestrogen therapy with tamoxifen for 10 years.  Tamoxifen counseling:We discussed the risks and benefits of tamoxifen. These include but not limited to insomnia, hot flashes, mood changes, vaginal dryness, and weight gain. Although rare, serious side effects including endometrial cancer, risk of blood clots were also discussed. We strongly believe that the benefits far outweigh the risks. Patient understands these risks and consented to starting treatment. Planned treatment duration is 10 years. I provided her with a prescription for tamoxifen and she will start this after radiation is complete.   return to clinic in March one month after starting tamoxifen therapy. I provided her with a prescription for tamoxifen. I discussed with her SOFT and TEXT trials which did not show an improvement in overall survival to adding ovarian suppression to antiestrogen therapy except in the subgroup of patients on high risk and required chemotherapy and also less than age 64.  No orders of the defined types were placed in this encounter.   The patient has a good understanding of the overall plan. she agrees with it. She will call with any problems that may develop before her next visit here.   Rulon Eisenmenger, MD 08/29/2014 2:20 PM

## 2014-08-29 NOTE — Progress Notes (Signed)
Radiation Oncology         [phone]) [phone] ________________________________  Name: Betty Foster MRN: [id number]  Date: 08/29/2014  DOB: 20-Mar-1966  Follow-Up Visit Note  CC: Quentin Cornwall, MD  Coralie Keens, MD  Diagnosis:    Breast cancer of upper-outer quadrant of right female breast   Staging form: Breast, AJCC 7th Edition     Clinical stage from 08/03/2014: Stage IA (T1c, N0, M0) - Signed by Marye Round, MD on 08/03/2014   Narrative:  The patient returns today for routine follow-up.  The patient completed lumpectomy which showed a stage IA breast cancer. An Oncotype test was performed and this returned a score of 20. She saw medical oncology earlier today and the recommendation was made not to proceed with any chemotherapy. She therefore is suitable to proceed with adjuvant radiation treatment which will be followed by anti-hormonal treatment. The patient's lumpectomy was completed on 08/03/2014. The resection margins are negative. The patient has had some difficulty with fluid buildup and has had a seroma drained on a couple of occasions. She feels that it has built up to some degree once again and she is scheduled to see surgery this Friday again for evaluation.                              ALLERGIES:  has No Known Allergies.  Meds: Current Outpatient Prescriptions  Medication Sig Dispense Refill  . atorvastatin (LIPITOR) 20 MG tablet Take 20 mg by mouth daily.    Marland Kitchen HYDROcodone-acetaminophen (NORCO) 5-325 MG per tablet Take 1-2 tablets by mouth every 4 (four) hours as needed. 40 tablet 0  . ibuprofen (ADVIL,MOTRIN) 200 MG tablet Take 200 mg by mouth every 6 (six) hours as needed for mild pain (head ache).    . tamoxifen (NOLVADEX) 20 MG tablet Take 1 tablet (20 mg total) by mouth daily. (Patient not taking: Reported on 08/29/2014) 90 tablet 3   No current facility-administered medications for this encounter.    Physical Findings: The patient is in no acute distress. Patient  is alert and oriented.  height is '5\' 2"'  (1.575 m) and weight is 120 lb 4.8 oz (54.568 kg). Her oral temperature is 98.2 F (36.8 C). Her blood pressure is 147/82 and her pulse is 62. Her respiration is 20. .     Lab Findings: Lab Results  Component Value Date   HGB 13.6 08/03/2014     Radiographic Findings: Nm Sentinel Node Inj-no Rpt (breast)  08/03/2014   CLINICAL DATA: right breast cancer   Sulfur colloid was injected intradermally by the nuclear medicine  technologist for breast cancer sentinel node localization.    Mm Breast Surgical Specimen  08/03/2014   CLINICAL DATA:  48 year old female with right breast carcinoma- evaluate lumpectomy specimen  EXAM: SPECIMEN RADIOGRAPH OF THE RIGHT BREAST  COMPARISON:  Previous exam(s)  FINDINGS: Status post excision of the right breast. The wire tip is present and marked for pathology. The mass is faint but identified.  IMPRESSION: Specimen radiograph of the right breast.   Electronically Signed   By: Hassan Rowan M.D.   On: 08/03/2014 15:50   [place] Rt Plc Breast Loc Dev   1st Lesion  Inc US Guide  08/03/2014   CLINICAL DATA:  48 year old female with right breast cancer. Wire localization prior to right lumpectomy.  EXAM: NEEDLE LOCALIZATION OF THE RIGHT BREAST WITH ULTRASOUND GUIDANCE  COMPARISON:  Previous exams.  FINDINGS: Patient  presents for needle localization prior to right lumpectomy. I met with the patient and we discussed the procedure of needle localization including benefits and alternatives. We discussed the high likelihood of a successful procedure. We discussed the risks of the procedure, including infection, bleeding, tissue injury, and further surgery. Informed, written consent was given. The usual time-out protocol was performed immediately prior to the procedure.  Using ultrasound guidance, sterile technique, 2% lidocaine and a 7 cm modified Kopans needle, the mass in the right axillary tail localized using a lateral approach. A skin mark  was placed directly over the mass. The films were marked for Dr. Ninfa Linden.  IMPRESSION: Needle localization right breast. No apparent complications.   Electronically Signed   By: Hassan Rowan M.D.   On: 08/03/2014 12:15    Impression:    The patient is status post lumpectomy for her early stage invasive lobular carcinoma of the right breast. She is going to be seen again by surgery for possible draining of a seroma, which she is done a couple of times. I discussed with her that we would like to begin radiation treatment after such procedures have been completed. I discussed with her tentatively proceeding with a simulation next Thursday if surgery feels that this is suitable.  Plan:  The patient would like to be treated in [place]. I will discuss with our staff their scheduling her for a simulation next Thursday. I anticipate treating the patient with adjuvant radiation treatment using tangent fields for approximately 6-1/2 weeks.  I spent 15 minutes with the patient today, the majority of which was spent counseling the patient on the diagnosis of cancer and coordinating care.   Jodelle Gross, M.D., Ph.D.

## 2014-08-29 NOTE — Assessment & Plan Note (Signed)
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 N0 M0 stage IA; Oncotype DX recurrence score 20, intermediate risk ROR13%  Oncotype DX recurrence score results based upon the recurrence risk of 20, I did not recommend adjuvant systemic chemotherapy based on the clinical profile. I recommended adjuvant radiation therapy followed by antiestrogen therapy with tamoxifen for 10 years.  Tamoxifen counseling:We discussed the risks and benefits of tamoxifen. These include but not limited to insomnia, hot flashes, mood changes, vaginal dryness, and weight gain. Although rare, serious side effects including endometrial cancer, risk of blood clots were also discussed. We strongly believe that the benefits far outweigh the risks. Patient understands these risks and consented to starting treatment. Planned treatment duration is 10 years. I provided her with a prescription for tamoxifen and she will start this after radiation is complete.   

## 2014-08-29 NOTE — Telephone Encounter (Signed)
per pof to sch pt appt-gave pt copy of sch °

## 2014-09-14 NOTE — Addendum Note (Signed)
Encounter addended by: Rebecca Eaton, RN on: 09/14/2014  8:56 AM<BR>     Documentation filed: Charges VN

## 2014-10-24 ENCOUNTER — Encounter: Payer: Self-pay | Admitting: Hematology and Oncology

## 2014-11-16 ENCOUNTER — Telehealth: Payer: Self-pay

## 2014-11-16 ENCOUNTER — Telehealth: Payer: Self-pay | Admitting: Hematology and Oncology

## 2014-11-16 ENCOUNTER — Other Ambulatory Visit: Payer: Self-pay

## 2014-11-16 DIAGNOSIS — C50411 Malignant neoplasm of upper-outer quadrant of right female breast: Secondary | ICD-10-CM

## 2014-11-16 MED ORDER — TAMOXIFEN CITRATE 20 MG PO TABS: 20.0000 mg | ORAL_TABLET | Freq: Every day | ORAL | Status: DC

## 2014-11-16 NOTE — Telephone Encounter (Signed)
Pt LM - returned call re: tamoxifen start.  Pt has not started - she cannot find prescription.  Escribed prescription to [facility] - receipt confirmed.  March appt cancelled.  pof entered for April appt.   LMOVM for pt - she should hear from scheduling within a week, and refill sent.  Pt to call with questions or needs.

## 2014-11-16 NOTE — Telephone Encounter (Signed)
, °

## 2014-11-16 NOTE — Telephone Encounter (Signed)
LMOVM - confirming patient had starting tamoxifen and next OV 3/2.  It patient has not started tamoxifen yet, she needs to call clinic so appt can be pushed rescheduled.

## 2014-11-27 ENCOUNTER — Other Ambulatory Visit: Payer: Self-pay | Admitting: *Deleted

## 2014-11-27 ENCOUNTER — Encounter: Payer: Self-pay | Admitting: Hematology and Oncology

## 2014-11-28 ENCOUNTER — Ambulatory Visit: Payer: PRIVATE HEALTH INSURANCE | Admitting: Hematology and Oncology

## 2014-11-28 ENCOUNTER — Telehealth: Payer: Self-pay | Admitting: Hematology and Oncology

## 2014-11-28 NOTE — Telephone Encounter (Signed)
Lft msg for pt confirming labs/ov earlier time per 03/01 POF and mailed schedule along with sent msg through mychart..... KJ

## 2014-12-27 ENCOUNTER — Other Ambulatory Visit: Payer: Self-pay | Admitting: *Deleted

## 2014-12-27 DIAGNOSIS — C50411 Malignant neoplasm of upper-outer quadrant of right female breast: Secondary | ICD-10-CM

## 2014-12-31 ENCOUNTER — Ambulatory Visit (HOSPITAL_BASED_OUTPATIENT_CLINIC_OR_DEPARTMENT_OTHER): Payer: PRIVATE HEALTH INSURANCE | Admitting: Hematology and Oncology

## 2014-12-31 ENCOUNTER — Other Ambulatory Visit (HOSPITAL_BASED_OUTPATIENT_CLINIC_OR_DEPARTMENT_OTHER): Payer: PRIVATE HEALTH INSURANCE

## 2014-12-31 ENCOUNTER — Other Ambulatory Visit: Payer: PRIVATE HEALTH INSURANCE

## 2014-12-31 ENCOUNTER — Ambulatory Visit: Payer: PRIVATE HEALTH INSURANCE | Admitting: Hematology and Oncology

## 2014-12-31 ENCOUNTER — Telehealth: Payer: Self-pay | Admitting: Hematology and Oncology

## 2014-12-31 VITALS — BP 124/82 | HR 67 | Temp 98.2°F | Resp 18 | Ht 62.0 in | Wt 113.1 lb

## 2014-12-31 DIAGNOSIS — Z170 Estrogen receptor positive status [ER+]: Secondary | ICD-10-CM

## 2014-12-31 DIAGNOSIS — C50611 Malignant neoplasm of axillary tail of right female breast: Secondary | ICD-10-CM

## 2014-12-31 DIAGNOSIS — C50411 Malignant neoplasm of upper-outer quadrant of right female breast: Secondary | ICD-10-CM

## 2014-12-31 LAB — CBC WITH DIFFERENTIAL/PLATELET
BASO%: 0.4 % (ref 0.0–2.0)
BASOS ABS: 0 10*3/uL (ref 0.0–0.1)
EOS%: 0.8 % (ref 0.0–7.0)
Eosinophils Absolute: 0 10*3/uL (ref 0.0–0.5)
HCT: 39.5 % (ref 34.8–46.6)
HGB: 13.6 g/dL (ref 11.6–15.9)
LYMPH%: 14.3 % (ref 14.0–49.7)
MCH: 33.3 pg (ref 25.1–34.0)
MCHC: 34.4 g/dL (ref 31.5–36.0)
MCV: 96.6 fL (ref 79.5–101.0)
MONO#: 0.3 10*3/uL (ref 0.1–0.9)
MONO%: 6.2 % (ref 0.0–14.0)
NEUT#: 3.8 10*3/uL (ref 1.5–6.5)
NEUT%: 78.3 % — ABNORMAL HIGH (ref 38.4–76.8)
PLATELETS: 164 10*3/uL (ref 145–400)
RBC: 4.09 10*6/uL (ref 3.70–5.45)
RDW: 13.3 % (ref 11.2–14.5)
WBC: 4.8 10*3/uL (ref 3.9–10.3)
lymph#: 0.7 10*3/uL — ABNORMAL LOW (ref 0.9–3.3)

## 2014-12-31 LAB — COMPREHENSIVE METABOLIC PANEL (CC13)
ALBUMIN: 4.2 g/dL (ref 3.5–5.0)
ALT: 10 U/L (ref 0–55)
AST: 11 U/L (ref 5–34)
Alkaline Phosphatase: 46 U/L (ref 40–150)
Anion Gap: 12 mEq/L — ABNORMAL HIGH (ref 3–11)
BUN: 11.1 mg/dL (ref 7.0–26.0)
CHLORIDE: 109 meq/L (ref 98–109)
CO2: 22 mEq/L (ref 22–29)
CREATININE: 0.8 mg/dL (ref 0.6–1.1)
Calcium: 9.4 mg/dL (ref 8.4–10.4)
EGFR: 88 mL/min/{1.73_m2} — ABNORMAL LOW (ref >90)
Glucose: 103 mg/dl (ref 70–140)
Potassium: 4.1 mEq/L (ref 3.5–5.1)
Sodium: 143 mEq/L (ref 136–145)
Total Bilirubin: 0.69 mg/dL (ref 0.20–1.20)
Total Protein: 7.4 g/dL (ref 6.4–8.3)

## 2014-12-31 NOTE — Progress Notes (Signed)
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 N0 M0 stage IA   08/31/2014 -  Anti-estrogen oral therapy  tamoxifen 20 mg daily 10 years    CHIEF COMPLIANT:  Follow-up of breast cancer on tamoxifen  INTERVAL HISTORY: Betty Foster is a  49 year old lady with above-mentioned history of right-sided breast cancer treated with lumpectomy and is currently on tamoxifen. She is tolerating extremely well with occasional hot flashes. Denies any myalgias. Denies any other major problems. The hot flashes have not been affecting her quality of life. She still works full-time and states extremely busy. She does not have much time for exercise.  REVIEW OF SYSTEMS:   Constitutional: Denies fevers, chills or abnormal weight loss Eyes: Denies blurriness of vision Ears, nose, mouth, throat, and face: Denies mucositis or sore throat Respiratory: Denies cough, dyspnea or wheezes Cardiovascular: Denies palpitation, chest discomfort or lower extremity swelling Gastrointestinal:  Denies nausea, heartburn or change in bowel habits Skin: Denies abnormal skin rashes Lymphatics: Denies new lymphadenopathy or easy bruising Neurological:Denies numbness, tingling or new weaknesses Behavioral/Psych: Mood is stable, no new  changes  Breast:  denies any pain or lumps or nodules in either breasts All other systems were reviewed with the patient and are negative.  I have reviewed the past medical history, past surgical history, social history and family history with the patient and they are unchanged from previous note.  ALLERGIES:  has No Known Allergies.  MEDICATIONS:  Current Outpatient Prescriptions  Medication Sig Dispense Refill  . atorvastatin (LIPITOR) 20 MG tablet Take 20 mg by mouth daily.    Marland Kitchen ibuprofen (ADVIL,MOTRIN) 200 MG tablet Take 200 mg by mouth every 6 (six) hours as needed for mild pain (head ache).    . tamoxifen (NOLVADEX) 20 MG tablet Take 1 tablet (20 mg total) by mouth daily. 90 tablet 3  . HYDROcodone-acetaminophen (NORCO) 5-325 MG per tablet Take 1-2 tablets by mouth every 4 (four) hours as needed. (Patient not taking: Reported on 12/31/2014) 40 tablet 0   No current facility-administered medications for this visit.    PHYSICAL EXAMINATION: ECOG PERFORMANCE STATUS: 0 - Asymptomatic  Filed Vitals:   12/31/14 0920  BP: 124/82  Pulse: 67  Temp: 98.2 F (36.8 C)  Resp: 18   Filed Weights   12/31/14 0920  Weight: 113 lb 1.6 oz (51.302 kg)    GENERAL:alert, no distress and comfortable SKIN: skin color, texture, turgor are normal, no rashes or significant lesions EYES: normal, Conjunctiva are pink and non-injected, sclera clear OROPHARYNX:no exudate, no erythema and lips, buccal mucosa, and tongue normal  NECK: supple, thyroid normal size, non-tender, without nodularity LYMPH:  no palpable lymphadenopathy in the cervical, axillary or inguinal LUNGS: clear to auscultation and percussion with normal breathing effort HEART: regular rate &  rhythm and no murmurs and no lower extremity edema ABDOMEN:abdomen soft, non-tender and normal bowel sounds Musculoskeletal:no cyanosis of digits and no clubbing  NEURO: alert & oriented x 3 with fluent speech, no focal motor/sensory  deficits  LABORATORY DATA:  I have reviewed the data as listed   Chemistry   No results found for: NA, K, CL, CO2, BUN, CREATININE, GLU No results found for: CALCIUM, ALKPHOS, AST, ALT, BILITOT     Lab Results  Component Value Date   WBC 4.8 12/31/2014   HGB 13.6 12/31/2014   HCT 39.5 12/31/2014   MCV 96.6 12/31/2014   PLT 164 12/31/2014   NEUTROABS 3.8 12/31/2014    ASSESSMENT & PLAN:  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 N0 M0 stage IA; Oncotype DX recurrence score 20, intermediate risk ROR 13% , tamoxifen started  December 2015  Tamoxifen toxicities: tolerating tamoxifen extremely well. 1.  Occasional hot flashes  Breast cancer surveillance: 1. Annual mammograms to be done end of September 2016 2. Breast exams every 6 months  Return to clinic in 6 months for follow-up  And breast exam.  No orders of the defined types were placed in this encounter.   The patient has a good understanding of the overall plan. she agrees with it. She will call with any problems that may develop before her next visit here.   Rulon Eisenmenger, MD

## 2014-12-31 NOTE — Assessment & Plan Note (Signed)
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 N0 M0 stage IA; Oncotype DX recurrence score 20, intermediate risk ROR 13%  Tamoxifen toxicities:  Breast cancer surveillance: 1. Annual mammograms to be done end of September 2016 2. Breast exams every 6 months  Return to clinic in 6 months for follow-up

## 2014-12-31 NOTE — Telephone Encounter (Signed)
Appointments made and avs printed for patient °

## 2015-03-13 ENCOUNTER — Other Ambulatory Visit: Payer: Self-pay | Admitting: Hematology and Oncology

## 2015-03-13 NOTE — Telephone Encounter (Signed)
Chart reviewed.

## 2015-03-27 ENCOUNTER — Telehealth: Payer: Self-pay | Admitting: Oncology

## 2015-03-27 NOTE — Telephone Encounter (Signed)
Faxed pt medical records to [facility] [phone]

## 2015-06-12 ENCOUNTER — Other Ambulatory Visit: Payer: Self-pay | Admitting: Physician Assistant

## 2015-06-12 DIAGNOSIS — C4491 Basal cell carcinoma of skin, unspecified: Secondary | ICD-10-CM

## 2015-06-12 DIAGNOSIS — D039 Melanoma in situ, unspecified: Secondary | ICD-10-CM

## 2015-06-12 DIAGNOSIS — D229 Melanocytic nevi, unspecified: Secondary | ICD-10-CM

## 2015-06-12 HISTORY — DX: Melanocytic nevi, unspecified: D22.9

## 2015-06-12 HISTORY — DX: Basal cell carcinoma of skin, unspecified: C44.91

## 2015-06-12 HISTORY — DX: Melanoma in situ, unspecified: D03.9

## 2015-06-13 ENCOUNTER — Telehealth: Payer: Self-pay | Admitting: *Deleted

## 2015-06-13 NOTE — Telephone Encounter (Signed)
Pt left a voice mail stating she is to have a 3D mammo prior to her return visit with you on 07/02/15. States she has not heard from anyone regarding scheduling the 3D mammo.

## 2015-06-14 ENCOUNTER — Other Ambulatory Visit: Payer: Self-pay | Admitting: *Deleted

## 2015-06-14 DIAGNOSIS — Z803 Family history of malignant neoplasm of breast: Secondary | ICD-10-CM

## 2015-06-14 DIAGNOSIS — C50411 Malignant neoplasm of upper-outer quadrant of right female breast: Secondary | ICD-10-CM

## 2015-06-18 ENCOUNTER — Telehealth: Payer: Self-pay | Admitting: *Deleted

## 2015-06-18 ENCOUNTER — Other Ambulatory Visit: Payer: Self-pay

## 2015-06-18 NOTE — Telephone Encounter (Signed)
TC from patient stating that she is scheduled to seeDr. Lindi Adie 07/02/15 and needs to have a 3D mammogram done prior to that visit. She states that she has not been notified of when that is scheduled. No appt for that noted in EPIC. Pt is unclear as to what she should do at this point.

## 2015-06-19 ENCOUNTER — Telehealth: Payer: Self-pay | Admitting: Hematology and Oncology

## 2015-06-19 NOTE — Telephone Encounter (Signed)
Called and left a message with mammo appointment °

## 2015-06-28 ENCOUNTER — Ambulatory Visit
Admission: RE | Admit: 2015-06-28 | Discharge: 2015-06-28 | Disposition: A | Discharge status: Home / Self Care | Payer: PRIVATE HEALTH INSURANCE | Source: Ambulatory Visit | Attending: Hematology and Oncology | Admitting: Hematology and Oncology

## 2015-06-28 DIAGNOSIS — C50411 Malignant neoplasm of upper-outer quadrant of right female breast: Secondary | ICD-10-CM

## 2015-06-28 DIAGNOSIS — Z803 Family history of malignant neoplasm of breast: Secondary | ICD-10-CM

## 2015-07-01 NOTE — Assessment & Plan Note (Signed)
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 N0 M0 stage IA; Oncotype DX recurrence score 20, intermediate risk ROR 13% , tamoxifen started December 2015  Tamoxifen toxicities: tolerating tamoxifen extremely well. 1. Occasional hot flashes  Breast cancer surveillance: 1. Annual mammograms 06/28/15: No abnormalities; Breast density category B 2. Breast exams 07/02/15: Normal  Return to clinic in 6 months for follow-upand breast exam.

## 2015-07-02 ENCOUNTER — Encounter: Payer: Self-pay | Admitting: Hematology and Oncology

## 2015-07-02 ENCOUNTER — Ambulatory Visit (HOSPITAL_BASED_OUTPATIENT_CLINIC_OR_DEPARTMENT_OTHER): Payer: PRIVATE HEALTH INSURANCE | Admitting: Hematology and Oncology

## 2015-07-02 ENCOUNTER — Telehealth: Payer: Self-pay | Admitting: Hematology and Oncology

## 2015-07-02 VITALS — BP 134/82 | HR 70 | Temp 98.5°F | Resp 18 | Ht 62.0 in | Wt 116.7 lb

## 2015-07-02 DIAGNOSIS — C50411 Malignant neoplasm of upper-outer quadrant of right female breast: Secondary | ICD-10-CM

## 2015-07-02 DIAGNOSIS — Z170 Estrogen receptor positive status [ER+]: Secondary | ICD-10-CM | POA: Diagnosis not present

## 2015-07-02 DIAGNOSIS — Z79810 Long term (current) use of selective estrogen receptor modulators (SERMs): Secondary | ICD-10-CM

## 2015-07-02 NOTE — Progress Notes (Signed)
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 N0 M0 stage IA   08/31/2014 -  Anti-estrogen oral therapy  tamoxifen 20 mg daily 10 years    CHIEF COMPLIANT:  Mild hot flashes, recent basal cell and melanoma as  INTERVAL HISTORY: Betty Foster is a  71 year with above-mentioned history of right breast invasive lobular cancer stage I who is currently on oral antiestrogen therapy with tamoxifen. She is tolerating it fairly well. She does have occasional hot flashes. 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 She is getting referred to [facility] for surgical resection. She reports no major problems or concerns otherwise.  REVIEW OF SYSTEMS:   Constitutional: Denies fevers, chills or abnormal weight loss Eyes: Denies blurriness of vision Ears, nose, mouth, throat, and face: Denies mucositis or sore throat Respiratory: Denies cough, dyspnea or wheezes Cardiovascular: Denies palpitation, chest discomfort or lower extremity swelling Gastrointestinal:  Denies nausea, heartburn or change in bowel habits Skin: Denies abnormal skin rashes Lymphatics: Denies new lymphadenopathy or easy bruising Neurological:Denies numbness,  tingling or new weaknesses Behavioral/Psych: Mood is stable, no new changes  Breast:  denies any pain or lumps or nodules in either breasts All other systems were reviewed with the patient and are negative.  I have reviewed the past medical history, past surgical history, social history and family history with the patient and they are unchanged from previous note.  ALLERGIES:  has No Known Allergies.  MEDICATIONS:  Current Outpatient Prescriptions  Medication Sig Dispense Refill  . atorvastatin (LIPITOR) 20 MG tablet Take 20 mg by mouth daily.    Marland Kitchen ibuprofen (ADVIL,MOTRIN) 200 MG tablet Take 200 mg by mouth every 6 (six) hours as needed for mild pain (head ache).    . tamoxifen (NOLVADEX) 20 MG tablet TAKE 1 TABLET BY MOUTH EVERY DAY 90 tablet 2  . HYDROcodone-acetaminophen (NORCO) 5-325 MG per tablet Take 1-2 tablets by mouth every 4 (four) hours as needed. (Patient not taking: Reported on 07/02/2015) 40 tablet 0   No current facility-administered medications for this visit.    PHYSICAL EXAMINATION: ECOG PERFORMANCE STATUS: 0 - Asymptomatic  Filed Vitals:   07/02/15 0919  BP: 134/82  Pulse: 70  Temp: 98.5 F (36.9 C)  Resp: 18   Filed Weights   07/02/15 0919  Weight: 116 lb 11.2 oz (52.935 kg)    GENERAL:alert, no distress and comfortable SKIN: skin color, texture, turgor are normal, no rashes or significant lesions EYES: normal, Conjunctiva are pink and non-injected, sclera clear OROPHARYNX:no exudate, no erythema and lips, buccal mucosa, and tongue normal  NECK: supple, thyroid normal size, non-tender, without nodularity LYMPH:  no palpable lymphadenopathy in the cervical, axillary  or inguinal LUNGS: clear to auscultation and percussion with normal breathing effort HEART: regular rate & rhythm and no murmurs and no lower extremity edema ABDOMEN:abdomen soft, non-tender and normal bowel sounds Musculoskeletal:no cyanosis of digits and no clubbing  NEURO: alert &  oriented x 3 with fluent speech, no focal motor/sensory deficits BREAST: No palpable masses or nodules in either right or left breasts. No palpable axillary supraclavicular or infraclavicular adenopathy no breast tenderness or nipple discharge. (exam performed in the presence of a chaperone)  LABORATORY DATA:  I have reviewed the data as listed   Chemistry      Component Value Date/Time   NA 143 12/31/2014 0904   K 4.1 12/31/2014 0904   CO2 22 12/31/2014 0904   BUN 11.1 12/31/2014 0904   CREATININE 0.8 12/31/2014 0904      Component Value Date/Time   CALCIUM 9.4 12/31/2014 0904   ALKPHOS 46 12/31/2014 0904   AST 11 12/31/2014 0904   ALT 10 12/31/2014 0904   BILITOT 0.69 12/31/2014 0904       Lab Results  Component Value Date   WBC 4.8 12/31/2014   HGB 13.6 12/31/2014   HCT 39.5 12/31/2014   MCV 96.6 12/31/2014   PLT 164 12/31/2014   NEUTROABS 3.8 12/31/2014   ASSESSMENT & PLAN:  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 N0 M0 stage IA; Oncotype DX recurrence score 20, intermediate risk ROR 13% , tamoxifen started December 2015  Tamoxifen toxicities: tolerating tamoxifen extremely well. 1. Occasional hot flashes  Breast cancer surveillance: 1. Annual mammograms 06/28/15: No abnormalities; Breast density category B 2. Breast exams 07/02/15: Normal  Skin melanoma: being resected at [facility]  Return to clinic in 6 months for follow-upand breast exam.  No orders of the defined types were placed in this encounter.   The patient has a good understanding of the overall plan. she agrees with it. she will call with any problems that may develop before the next visit here.   Rulon Eisenmenger, MD

## 2015-07-02 NOTE — Telephone Encounter (Signed)
Appointments made and avs printed for pateint °

## 2015-08-13 DIAGNOSIS — D039 Melanoma in situ, unspecified: Secondary | ICD-10-CM | POA: Insufficient documentation

## 2015-09-26 ENCOUNTER — Other Ambulatory Visit: Payer: Self-pay | Admitting: Physician Assistant

## 2015-12-31 ENCOUNTER — Encounter: Payer: Self-pay | Admitting: Hematology and Oncology

## 2015-12-31 ENCOUNTER — Telehealth: Payer: Self-pay | Admitting: Hematology and Oncology

## 2015-12-31 ENCOUNTER — Ambulatory Visit (HOSPITAL_BASED_OUTPATIENT_CLINIC_OR_DEPARTMENT_OTHER): Payer: PRIVATE HEALTH INSURANCE | Admitting: Hematology and Oncology

## 2015-12-31 VITALS — BP 129/80 | HR 72 | Temp 98.2°F | Resp 18 | Ht 62.0 in | Wt 120.8 lb

## 2015-12-31 DIAGNOSIS — Z170 Estrogen receptor positive status [ER+]: Secondary | ICD-10-CM

## 2015-12-31 DIAGNOSIS — Z79810 Long term (current) use of selective estrogen receptor modulators (SERMs): Secondary | ICD-10-CM

## 2015-12-31 DIAGNOSIS — C50411 Malignant neoplasm of upper-outer quadrant of right female breast: Secondary | ICD-10-CM | POA: Diagnosis not present

## 2015-12-31 DIAGNOSIS — Z85820 Personal history of malignant melanoma of skin: Secondary | ICD-10-CM | POA: Diagnosis not present

## 2015-12-31 NOTE — Telephone Encounter (Signed)
Faxed requested records to [facility].

## 2015-12-31 NOTE — Assessment & Plan Note (Signed)
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 N0 M0 stage IA; Oncotype DX recurrence score 20, intermediate risk ROR 13% , tamoxifen started December 2015  Tamoxifen toxicities: tolerating tamoxifen extremely well. 1. Occasional hot flashes  Breast cancer surveillance: 1. Annual mammograms 06/28/15: No abnormalities; Breast density category B 2. Breast exams 12/31/15: Normal  Skin melanoma: being resected at [facility]  Return to clinic in 6 months for follow-upand breast exam

## 2015-12-31 NOTE — Progress Notes (Signed)
Unable to get in to exam room prior to MD.  No assessment performed.  

## 2015-12-31 NOTE — Telephone Encounter (Signed)
appt made and avs printed °

## 2015-12-31 NOTE — Progress Notes (Signed)
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 N0 M0 stage IA   08/31/2014 -  Anti-estrogen oral therapy  tamoxifen 20 mg daily 10 years    CHIEF COMPLIANT: Follow-up on tamoxifen therapy  INTERVAL HISTORY: Betty Foster is a 50 year old with above-mentioned history of right breast lobular cancer currently on adjuvant antiestrogen therapy with tamoxifen. She denies any hot flashes or myalgias. She has no problems or concerns with her breasts. Denies any lumps or nodules.  REVIEW OF SYSTEMS:   Constitutional: Denies fevers, chills or abnormal weight loss Eyes: Denies blurriness of vision Ears, nose, mouth, throat, and face: Denies mucositis or sore throat Respiratory: Denies cough, dyspnea or wheezes Cardiovascular: Denies palpitation, chest discomfort Gastrointestinal:  Denies nausea, heartburn or change in bowel habits Skin: Denies abnormal skin rashes Lymphatics: Denies new lymphadenopathy or easy bruising Neurological:Denies numbness, tingling or new weaknesses Behavioral/Psych: Mood is stable, no new changes  Extremities: No lower extremity edema Breast:  denies any pain or lumps or nodules in either breasts All other systems were reviewed with the patient and are  negative.  I have reviewed the past medical history, past surgical history, social history and family history with the patient and they are unchanged from previous note.  ALLERGIES:  has No Known Allergies.  MEDICATIONS:  Current Outpatient Prescriptions  Medication Sig Dispense Refill  . atorvastatin (LIPITOR) 20 MG tablet Take 20 mg by mouth daily.    Marland Kitchen HYDROcodone-acetaminophen (NORCO) 5-325 MG per tablet Take 1-2 tablets by mouth every 4 (four) hours as needed. (Patient not taking: Reported on 07/02/2015) 40 tablet 0  . ibuprofen (ADVIL,MOTRIN) 200 MG tablet Take 200 mg by mouth every 6 (six) hours as needed for mild pain (head ache).    . tamoxifen (NOLVADEX) 20 MG tablet TAKE 1 TABLET BY MOUTH EVERY DAY 90 tablet 2   No current facility-administered medications for this visit.    PHYSICAL EXAMINATION: ECOG PERFORMANCE STATUS: 0 - Asymptomatic  Filed Vitals:   12/31/15 0908  BP: 129/80  Pulse: 72  Temp: 98.2 F (36.8 C)  Resp: 18   Filed Weights   12/31/15 0908  Weight: 120 lb 12.8 oz (54.795 kg)    GENERAL:alert, no distress and comfortable SKIN: skin color, texture, turgor are normal, no rashes or significant lesions EYES: normal, Conjunctiva are pink and non-injected, sclera clear OROPHARYNX:no exudate, no erythema and lips, buccal mucosa, and tongue normal  NECK: supple, thyroid normal size, non-tender, without nodularity LYMPH:  no palpable lymphadenopathy in the cervical, axillary or inguinal LUNGS: clear to auscultation and percussion with normal breathing effort HEART: regular rate & rhythm and no murmurs and no lower extremity edema ABDOMEN:abdomen soft, non-tender and normal bowel sounds MUSCULOSKELETAL:no cyanosis of digits and no clubbing  NEURO: alert &  oriented x 3 with fluent speech, no focal motor/sensory deficits EXTREMITIES: No lower extremity edema BREAST: No palpable masses or nodules in either right or left breasts. No palpable axillary  supraclavicular or infraclavicular adenopathy no breast tenderness or nipple discharge. (exam performed in the presence of a chaperone)  LABORATORY DATA:  I have reviewed the data as listed   Chemistry      Component Value Date/Time   NA 143 12/31/2014 0904   K 4.1 12/31/2014 0904   CO2 22 12/31/2014 0904   BUN 11.1 12/31/2014 0904   CREATININE 0.8 12/31/2014 0904      Component Value Date/Time   CALCIUM 9.4 12/31/2014 0904   ALKPHOS 46 12/31/2014 0904   AST 11 12/31/2014 0904   ALT 10 12/31/2014 0904   BILITOT 0.69 12/31/2014 0904       Lab Results  Component Value Date   WBC 4.8 12/31/2014   HGB 13.6 12/31/2014   HCT 39.5 12/31/2014   MCV 96.6 12/31/2014   PLT 164 12/31/2014   NEUTROABS 3.8 12/31/2014   ASSESSMENT & PLAN:  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 N0 M0 stage IA; Oncotype DX recurrence score 20, intermediate risk ROR 13% , tamoxifen started December 2015  Tamoxifen toxicities: tolerating tamoxifen extremely well. 1. Occasional hot flashes  Breast cancer surveillance: 1. Annual mammograms 06/28/15: No abnormalities; Breast density category B 2. 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   The patient has a good understanding of the overall plan. she agrees with it. she will call with any problems that may develop before the next visit here.   Rulon Eisenmenger, MD 12/31/2015

## 2016-04-06 ENCOUNTER — Other Ambulatory Visit: Payer: Self-pay | Admitting: Hematology and Oncology

## 2016-04-06 NOTE — Telephone Encounter (Signed)
Chart reviewed.

## 2016-05-25 ENCOUNTER — Other Ambulatory Visit: Payer: Self-pay | Admitting: Hematology and Oncology

## 2016-05-25 DIAGNOSIS — Z853 Personal history of malignant neoplasm of breast: Secondary | ICD-10-CM

## 2016-06-29 ENCOUNTER — Ambulatory Visit
Admission: RE | Admit: 2016-06-29 | Discharge: 2016-06-29 | Disposition: A | Discharge status: Home / Self Care | Payer: PRIVATE HEALTH INSURANCE | Source: Ambulatory Visit | Attending: Hematology and Oncology | Admitting: Hematology and Oncology

## 2016-06-29 DIAGNOSIS — Z853 Personal history of malignant neoplasm of breast: Secondary | ICD-10-CM

## 2016-12-06 ENCOUNTER — Telehealth: Payer: Self-pay

## 2016-12-06 NOTE — Telephone Encounter (Signed)
Called and left a message with new appt   Betty Foster

## 2016-12-14 NOTE — Telephone Encounter (Signed)
Returned call to patient re rescheduling f/u with Dr. Lindi Adie. Left message for patient and asked patient call me re a date/time when she can reschedule f/u.

## 2016-12-18 ENCOUNTER — Telehealth: Payer: Self-pay | Admitting: Hematology and Oncology

## 2016-12-18 NOTE — Telephone Encounter (Signed)
Returned call to patient re new date/time for f/u with Dr. Lindi Adie. I made several phone calls to patient re rescheduling, which she has returned, however we have not been able to connect directly.   Left message for patient today re 3/27 f/u at 8:45 am based on last message from patient on 3/22 responded to my message to inform me she can come in for f/u on 3/29, 3/30 or 4/10 in the AM.

## 2016-12-22 ENCOUNTER — Other Ambulatory Visit: Payer: Self-pay | Admitting: Dermatology

## 2016-12-24 ENCOUNTER — Encounter: Payer: Self-pay | Admitting: Hematology and Oncology

## 2016-12-24 ENCOUNTER — Ambulatory Visit (HOSPITAL_BASED_OUTPATIENT_CLINIC_OR_DEPARTMENT_OTHER): Payer: PRIVATE HEALTH INSURANCE | Admitting: Hematology and Oncology

## 2016-12-24 DIAGNOSIS — Z170 Estrogen receptor positive status [ER+]: Secondary | ICD-10-CM | POA: Diagnosis not present

## 2016-12-24 DIAGNOSIS — Z79810 Long term (current) use of selective estrogen receptor modulators (SERMs): Secondary | ICD-10-CM | POA: Diagnosis not present

## 2016-12-24 DIAGNOSIS — C50411 Malignant neoplasm of upper-outer quadrant of right female breast: Secondary | ICD-10-CM

## 2016-12-24 DIAGNOSIS — Z85820 Personal history of malignant melanoma of skin: Secondary | ICD-10-CM

## 2016-12-24 MED ORDER — TAMOXIFEN CITRATE 20 MG PO TABS: 20.0000 mg | ORAL_TABLET | Freq: Every day | ORAL | 3 refills | Status: DC

## 2016-12-24 NOTE — Assessment & Plan Note (Signed)
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 N0 M0 stage IA; Oncotype DX recurrence score 20, intermediate risk ROR 13% , tamoxifen started December 2015  Tamoxifen toxicities: tolerating tamoxifen extremely well. 1. Occasional hot flashes  Breast cancer surveillance: 1. Annual mammograms 06/29/16: No abnormalities; Breast density category B 2. 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

## 2016-12-24 NOTE — Progress Notes (Signed)
Patient Care Team: Quentin Cornwall, MD as PCP - General (Family Medicine)  DIAGNOSIS:  Encounter Diagnosis  Name Primary?  . 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 N0 M0 stage IA      08/31/2014 -  Anti-estrogen oral therapy     tamoxifen 20 mg daily 10 years       CHIEF COMPLIANT: Follow-up on tamoxifen therapy  INTERVAL HISTORY: Betty Foster is a 51 year old above-mentioned fluid was consistent with lumpectomy and is currently on tamoxifen therapy. She is tolerating it extremely well. She denies any hot flashes or myalgias. Denies any lumps or nodules in the breast.  REVIEW OF SYSTEMS:   Constitutional: Denies fevers, chills or abnormal weight loss Eyes: Denies blurriness of vision Ears, nose, mouth, throat, and face: Denies mucositis or sore throat Respiratory: Denies cough, dyspnea or wheezes Cardiovascular: Denies palpitation, chest discomfort Gastrointestinal:  Denies nausea, heartburn or change in bowel habits Skin: Denies abnormal skin rashes Lymphatics: Denies new lymphadenopathy or easy bruising Neurological:Denies numbness, tingling or new weaknesses Behavioral/Psych: Mood is stable, no new changes  Extremities: No lower extremity edema Breast:  denies any pain or lumps or nodules in either breasts All other systems were reviewed with the patient and are negative.  I have reviewed the past medical history, past  surgical history, social history and family history with the patient and they are unchanged from previous note.  ALLERGIES:  has No Known Allergies.  MEDICATIONS:  Current Outpatient Prescriptions  Medication Sig Dispense Refill  . atorvastatin (LIPITOR) 20 MG tablet Take 20 mg by mouth daily.    Marland Kitchen HYDROcodone-acetaminophen (NORCO) 5-325 MG per tablet Take 1-2 tablets by mouth every 4 (four) hours as needed. (Patient not taking: Reported on 07/02/2015) 40 tablet 0  . ibuprofen (ADVIL,MOTRIN) 200 MG tablet Take 200 mg by mouth every 6 (six) hours as needed for mild pain (head ache).    . tamoxifen (NOLVADEX) 20 MG tablet TAKE 1 TABLET BY MOUTH EVERY DAY 90 tablet 2   No current facility-administered medications for this visit.     PHYSICAL EXAMINATION: ECOG PERFORMANCE STATUS: 0 - Asymptomatic  Vitals:   12/24/16 0857  BP: 139/84  Pulse: 65  Resp: 18  Temp: 98.5 F (36.9 C)   Filed Weights   12/24/16 0857  Weight: 128 lb 6.4 oz (58.2 kg)    GENERAL:alert, no distress and comfortable SKIN: skin color, texture, turgor are normal, no rashes or significant lesions EYES: normal, Conjunctiva are pink and non-injected, sclera clear OROPHARYNX:no exudate, no erythema and lips, buccal mucosa, and tongue normal  NECK: supple, thyroid normal size, non-tender, without nodularity LYMPH:  no palpable lymphadenopathy in the cervical, axillary or inguinal LUNGS: clear to auscultation and percussion with normal breathing effort HEART: regular rate & rhythm and no murmurs and no lower extremity edema ABDOMEN:abdomen soft, non-tender and normal bowel sounds MUSCULOSKELETAL:no cyanosis of digits and  no clubbing  NEURO: alert & oriented x 3 with fluent speech, no focal motor/sensory deficits EXTREMITIES: No lower extremity edema BREAST: No palpable masses or nodules in either right or left breasts. No palpable axillary supraclavicular or infraclavicular adenopathy no breast tenderness or nipple  discharge. (exam performed in the presence of a chaperone)  LABORATORY DATA:  I have reviewed the data as listed   Chemistry      Component Value Date/Time   NA 143 12/31/2014 0904   K 4.1 12/31/2014 0904   CO2 22 12/31/2014 0904   BUN 11.1 12/31/2014 0904   CREATININE 0.8 12/31/2014 0904      Component Value Date/Time   CALCIUM 9.4 12/31/2014 0904   ALKPHOS 46 12/31/2014 0904   AST 11 12/31/2014 0904   ALT 10 12/31/2014 0904   BILITOT 0.69 12/31/2014 0904       Lab Results  Component Value Date   WBC 4.8 12/31/2014   HGB 13.6 12/31/2014   HCT 39.5 12/31/2014   MCV 96.6 12/31/2014   PLT 164 12/31/2014   NEUTROABS 3.8 12/31/2014    ASSESSMENT & PLAN:  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 N0 M0 stage IA; Oncotype DX recurrence score 20, intermediate risk ROR 13% , tamoxifen started December 2015  Tamoxifen toxicities: tolerating tamoxifen extremely well. 1. Occasional hot flashes  Breast cancer surveillance: 1. Annual mammograms 06/29/16: No abnormalities; Breast density category B 2. 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  No orders of the defined types were placed in this encounter.  The patient has a good understanding of the overall plan. she agrees with it. she will call with any problems that may develop before the next visit here.   Rulon Eisenmenger, MD 12/24/16

## 2016-12-30 ENCOUNTER — Ambulatory Visit: Payer: PRIVATE HEALTH INSURANCE | Admitting: Hematology and Oncology

## 2017-01-11 ENCOUNTER — Ambulatory Visit: Payer: PRIVATE HEALTH INSURANCE | Admitting: Hematology and Oncology

## 2017-01-21 ENCOUNTER — Other Ambulatory Visit: Payer: Self-pay | Admitting: Physician Assistant

## 2017-06-01 ENCOUNTER — Other Ambulatory Visit: Payer: Self-pay | Admitting: Hematology and Oncology

## 2017-06-01 DIAGNOSIS — Z853 Personal history of malignant neoplasm of breast: Secondary | ICD-10-CM

## 2017-06-03 ENCOUNTER — Telehealth: Payer: Self-pay | Admitting: Hematology and Oncology

## 2017-06-03 NOTE — Telephone Encounter (Signed)
[facility] FAXED RECORDS TO [facility]

## 2017-07-05 ENCOUNTER — Ambulatory Visit
Admission: RE | Admit: 2017-07-05 | Discharge: 2017-07-05 | Disposition: A | Discharge status: Home / Self Care | Payer: PRIVATE HEALTH INSURANCE | Source: Ambulatory Visit | Attending: Hematology and Oncology | Admitting: Hematology and Oncology

## 2017-07-05 DIAGNOSIS — Z853 Personal history of malignant neoplasm of breast: Secondary | ICD-10-CM

## 2017-12-24 ENCOUNTER — Inpatient Hospital Stay: Payer: PRIVATE HEALTH INSURANCE | Attending: Hematology and Oncology | Admitting: Hematology and Oncology

## 2017-12-24 ENCOUNTER — Telehealth: Payer: Self-pay | Admitting: Hematology and Oncology

## 2017-12-24 DIAGNOSIS — C50411 Malignant neoplasm of upper-outer quadrant of right female breast: Secondary | ICD-10-CM

## 2017-12-24 DIAGNOSIS — Z85820 Personal history of malignant melanoma of skin: Secondary | ICD-10-CM

## 2017-12-24 DIAGNOSIS — Z79810 Long term (current) use of selective estrogen receptor modulators (SERMs): Secondary | ICD-10-CM | POA: Diagnosis not present

## 2017-12-24 DIAGNOSIS — Z170 Estrogen receptor positive status [ER+]: Secondary | ICD-10-CM

## 2017-12-24 MED ORDER — TAMOXIFEN CITRATE 20 MG PO TABS: 20.0000 mg | ORAL_TABLET | Freq: Every day | ORAL | 3 refills | Status: DC

## 2017-12-24 NOTE — Progress Notes (Signed)
Patient Care Team: Quentin Cornwall, MD as PCP - General (Family Medicine)  DIAGNOSIS:  Encounter Diagnosis  Name Primary?  . 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 N0 M0 stage IA      09/22/2014 - 11/05/2014 Radiation Therapy    XRT      11/30/2014 -  Anti-estrogen oral therapy     tamoxifen 20 mg daily 10 years       CHIEF COMPLIANT: Follow-up on tamoxifen therapy  INTERVAL HISTORY: Betty Foster is a 52 year old with above-mentioned history of invasive lobular cancer of the right breast was treated with lumpectomy followed by radiation and is currently on tamoxifen therapy.  She is tolerating tamoxifen reasonably well.  She does have occasional hot flashes but denies any arthralgias or myalgias.  Denies any uterine problems.  She gets menstrual cycles every couple of months and they tend to be heavy.  REVIEW OF SYSTEMS:   Constitutional: Denies fevers, chills or abnormal weight loss Eyes: Denies blurriness of vision Ears, nose, mouth, throat, and face: Denies mucositis or sore throat Respiratory: Denies cough, dyspnea or wheezes Cardiovascular: Denies palpitation, chest discomfort Gastrointestinal:  Denies nausea, heartburn or change in bowel habits Skin: Denies abnormal skin rashes Lymphatics: Denies new lymphadenopathy or easy bruising Neurological:Denies numbness, tingling or new weaknesses Behavioral/Psych: Mood is  stable, no new changes  Extremities: No lower extremity edema Breast:  denies any pain or lumps or nodules in either breasts All other systems were reviewed with the patient and are negative.  I have reviewed the past medical history, past surgical history, social history and family history with the patient and they are unchanged from previous note.  ALLERGIES:  has No Known Allergies.  MEDICATIONS:  Current Outpatient Medications  Medication Sig Dispense Refill  . atorvastatin (LIPITOR) 20 MG tablet Take 20 mg by mouth daily.    Marland Kitchen ibuprofen (ADVIL,MOTRIN) 200 MG tablet Take 200 mg by mouth every 6 (six) hours as needed for mild pain (head ache).    . tamoxifen (NOLVADEX) 20 MG tablet Take 1 tablet (20 mg total) by mouth daily. 90 tablet 3   No current facility-administered medications for this visit.     PHYSICAL EXAMINATION: ECOG PERFORMANCE STATUS: 1 - Symptomatic but completely ambulatory  Vitals:   12/24/17 0907  BP: (!) 155/81  Pulse: 62  Resp: 18  Temp: 98.5 F (36.9 C)  SpO2: 100%   Filed Weights   12/24/17 0907  Weight: 131 lb 8 oz (59.6 kg)    GENERAL:alert, no distress and comfortable SKIN: skin color, texture, turgor are normal, no rashes or significant lesions EYES: normal, Conjunctiva are pink and non-injected, sclera clear OROPHARYNX:no exudate, no erythema and lips, buccal mucosa, and tongue normal  NECK: supple, thyroid normal size, non-tender, without nodularity LYMPH:  no palpable lymphadenopathy in the cervical, axillary or inguinal LUNGS: clear to auscultation and percussion with normal breathing  effort HEART: regular rate & rhythm and no murmurs and no lower extremity edema ABDOMEN:abdomen soft, non-tender and normal bowel sounds MUSCULOSKELETAL:no cyanosis of digits and no clubbing  NEURO: alert & oriented x 3 with fluent speech, no focal motor/sensory deficits EXTREMITIES: No lower extremity edema BREAST: No palpable masses or nodules in either  right or left breasts. No palpable axillary supraclavicular or infraclavicular adenopathy no breast tenderness or nipple discharge. (exam performed in the presence of a chaperone)  LABORATORY DATA:  I have reviewed the data as listed CMP Latest Ref Rng & Units 12/31/2014  Glucose 70 - 140 mg/dl 103  BUN 7.0 - 26.0 mg/dL 11.1  Creatinine 0.6 - 1.1 mg/dL 0.8  Sodium 136 - 145 mEq/L 143  Potassium 3.5 - 5.1 mEq/L 4.1  CO2 22 - 29 mEq/L 22  Calcium 8.4 - 10.4 mg/dL 9.4  Total Protein 6.4 - 8.3 g/dL 7.4  Total Bilirubin 0.20 - 1.20 mg/dL 0.69  Alkaline Phos 40 - 150 U/L 46  AST 5 - 34 U/L 11  ALT 0 - 55 U/L 10    Lab Results  Component Value Date   WBC 4.8 12/31/2014   HGB 13.6 12/31/2014   HCT 39.5 12/31/2014   MCV 96.6 12/31/2014   PLT 164 12/31/2014   NEUTROABS 3.8 12/31/2014    ASSESSMENT & PLAN:  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 N0 M0 stage IA; Oncotype DX recurrence score 20, intermediate risk ROR 13% , tamoxifen started December 2015  Tamoxifen toxicities: Monitoring closely for toxicities . 1. Occasional hot flashes I recommended 10 years of antiestrogen therapy with tamoxifen  Breast cancer surveillance: 1. Annual mammograms 07/05/17: No abnormalities; Breast density category B 2. 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  The patient has a good understanding of the overall plan. she agrees with it. she will call with any problems that may develop before the next visit here.   Harriette Ohara, MD 12/24/17

## 2017-12-24 NOTE — Assessment & Plan Note (Signed)
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 N0 M0 stage IA; Oncotype DX recurrence score 20, intermediate risk ROR 13% , tamoxifen started December 2015  Tamoxifen toxicities: Monitoring closely for toxicities . 1. Occasional hot flashes I recommended 10 years of antiestrogen therapy with tamoxifen  Breast cancer surveillance: 1. Annual mammograms 07/05/17: No abnormalities; Breast density category B 2. 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

## 2017-12-24 NOTE — Telephone Encounter (Signed)
Gave patient AVS and calendar of upcoming April 2020 appointments.  °

## 2018-06-03 ENCOUNTER — Encounter: Payer: Self-pay | Admitting: Gastroenterology

## 2018-06-03 ENCOUNTER — Other Ambulatory Visit: Payer: Self-pay | Admitting: Hematology and Oncology

## 2018-06-03 DIAGNOSIS — Z853 Personal history of malignant neoplasm of breast: Secondary | ICD-10-CM

## 2018-07-08 ENCOUNTER — Encounter: Payer: Self-pay | Admitting: Family Medicine

## 2018-07-08 ENCOUNTER — Ambulatory Visit
Admission: RE | Admit: 2018-07-08 | Discharge: 2018-07-08 | Disposition: A | Discharge status: Home / Self Care | Payer: PRIVATE HEALTH INSURANCE | Source: Ambulatory Visit | Attending: Hematology and Oncology | Admitting: Hematology and Oncology

## 2018-07-08 DIAGNOSIS — Z853 Personal history of malignant neoplasm of breast: Secondary | ICD-10-CM

## 2018-07-26 ENCOUNTER — Encounter: Payer: PRIVATE HEALTH INSURANCE | Admitting: Gastroenterology

## 2018-09-09 ENCOUNTER — Encounter: Payer: Self-pay | Admitting: Gastroenterology

## 2018-10-06 ENCOUNTER — Ambulatory Visit (AMBULATORY_SURGERY_CENTER): Payer: Self-pay

## 2018-10-06 VITALS — Ht 62.0 in | Wt 136.0 lb

## 2018-10-06 DIAGNOSIS — Z1211 Encounter for screening for malignant neoplasm of colon: Secondary | ICD-10-CM

## 2018-10-06 MED ORDER — PEG 3350-KCL-NA BICARB-NACL 420 G PO SOLR: 4000.0000 mL | Freq: Once | ORAL | 0 refills | Status: AC

## 2018-10-06 NOTE — Progress Notes (Signed)
No egg or soy allergy known to patient  No issues with past sedation with any surgeries  or procedures, no intubation problems  No diet pills per patient No home 02 use per patient  No blood thinners per patient  Pt denies issues with constipation  No A fib or A flutter  EMMI video sent to pt's e mail pt declined   

## 2018-10-07 ENCOUNTER — Encounter: Payer: Self-pay | Admitting: Gastroenterology

## 2018-10-18 ENCOUNTER — Encounter: Payer: Self-pay | Admitting: Gastroenterology

## 2018-10-18 ENCOUNTER — Ambulatory Visit (AMBULATORY_SURGERY_CENTER): Payer: PRIVATE HEALTH INSURANCE | Admitting: Gastroenterology

## 2018-10-18 VITALS — BP 150/97 | HR 70 | Temp 98.4°F | Resp 20 | Ht 62.0 in | Wt 131.0 lb

## 2018-10-18 DIAGNOSIS — D128 Benign neoplasm of rectum: Secondary | ICD-10-CM

## 2018-10-18 DIAGNOSIS — K621 Rectal polyp: Secondary | ICD-10-CM | POA: Diagnosis not present

## 2018-10-18 DIAGNOSIS — Z1211 Encounter for screening for malignant neoplasm of colon: Secondary | ICD-10-CM | POA: Diagnosis not present

## 2018-10-18 DIAGNOSIS — D129 Benign neoplasm of anus and anal canal: Secondary | ICD-10-CM

## 2018-10-18 MED ORDER — SODIUM CHLORIDE 0.9 % IV SOLN: 500.0000 mL | Freq: Once | INTRAVENOUS | Status: AC

## 2018-10-18 NOTE — Progress Notes (Signed)
A and O x3. Report to RN. Tolerated MAC anesthesia well.

## 2018-10-18 NOTE — Op Note (Signed)
[facility] Patient Name: Betty Foster Procedure Date: 10/18/2018 9:06 AM MRN: [id number] Endoscopist: Justice Britain , MD Age: 53 Referring MD:  Date of Birth: [birth year] Gender: Female Account #: [id number] Procedure:                Colonoscopy Indications:              Screening for colorectal malignant neoplasm Medicines:                Monitored Anesthesia Care Procedure:                Pre-Anesthesia Assessment:                           - Prior to the procedure, a History and Physical                            was performed, and patient medications and                            allergies were reviewed. The patient's tolerance of                            previous anesthesia was also reviewed. The risks                            and benefits of the procedure and the sedation                            options and risks were discussed with the patient.                            All questions were answered, and informed consent                            was obtained. Prior Anticoagulants: The patient has                            taken no previous anticoagulant or antiplatelet                            agents. ASA Grade Assessment: I - A normal, healthy                            patient. After reviewing the risks and benefits,                            the patient was deemed in satisfactory condition to                            undergo the procedure.                           After obtaining informed consent, the colonoscope  was passed under direct vision. Throughout the                            procedure, the patient's blood pressure, pulse, and                            oxygen saturations were monitored continuously. The                            Colonoscope was introduced through the anus and                            advanced to the the cecum, identified by the                            appendiceal orifice, ileocecal  valve and palpation.                            The colonoscopy was technically difficult and                            complex due to a tortuous colon. Successful                            completion of the procedure was aided by changing                            the patient's position, using manual pressure,                            straightening and shortening the scope to obtain                            bowel loop reduction and using scope torsion. The                            patient tolerated the procedure. The quality of the                            bowel preparation was evaluated using the BBPS                            [facility] Bowel Preparation Scale) with scores of:                            Right Colon = 2 (minor amount of residual staining,                            small fragments of stool and/or opaque liquid, but                            mucosa seen well), Transverse Colon = 3 (entire  mucosa seen well with no residual staining, small                            fragments of stool or opaque liquid) and Left Colon                            = 3 (entire mucosa seen well with no residual                            staining, small fragments of stool or opaque                            liquid). The total BBPS score equals 8. The quality                            of the bowel preparation was good. Scope In: 9:12:09 AM Scope Out: 9:35:35 AM Scope Withdrawal Time: 0 hours 12 minutes 25 seconds  Total Procedure Duration: 0 hours 23 minutes 26 seconds  Findings:                 Skin tags were found on perianal exam.                           The digital rectal exam findings include                            non-thrombosed internal hemorrhoids. Pertinent                            negatives include no palpable rectal lesions.                           The terminal ileum and ileocecal valve appeared                            normal.                            Two sessile polyps were found in the rectum. The                            polyps were 1 to 2 mm in size. These polyps were                            removed with a cold biopsy forceps. Resection and                            retrieval were complete.                           Normal mucosa was found in the entire colon                            otherwise.  Anal papilla was hypertrophied.                           Non-bleeding non-thrombosed internal hemorrhoids                            were found during retroflexion, during perianal                            exam and during digital exam. The hemorrhoids were                            Grade II (internal hemorrhoids that prolapse but                            reduce spontaneously). Complications:            No immediate complications. Estimated Blood Loss:     Estimated blood loss was minimal. Impression:               - Perianal skin tags found on perianal exam.                           - Non-thrombosed internal hemorrhoids found on                            digital rectal exam.                           - The examined portion of the ileum was normal.                           - Two 1 to 2 mm polyps in the rectum, removed with                            a cold biopsy forceps. Resected and retrieved.                           - Normal mucosa in the entire examined colon                            otherwise.                           - Anal papilla was hypertrophied.                           - Non-bleeding non-thrombosed internal hemorrhoids. Recommendation:           - The patient will be observed post-procedure,                            until all discharge criteria are met.                           - Discharge patient to home.                           -  Patient has a contact number available for                            emergencies. The signs and symptoms of potential                             delayed complications were discussed with the                            patient. Return to normal activities tomorrow.                            Written discharge instructions were provided to the                            patient.                           - High fiber diet.                           - Use fiber, for example Citrucel, Fibercon, Konsyl                            or Metamucil.                           - Await pathology results.                           - Repeat colonoscopy in 5-10 years for surveillance                            based on pathology results and findings of                            adenomatous tissue.                           - The findings and recommendations were discussed                            with the patient.                           - The findings and recommendations were discussed                            with the patient's family. Justice Britain, MD 10/18/2018 9:43:37 AM

## 2018-10-18 NOTE — Patient Instructions (Signed)
2 polyps removed today. Depending on pathology you will need to come back in 5 vs 10 years.  Dr Rush Landmark will mail you a letter explaining in 2-3 weeks from now. Handouts given on polyps and high fiber diet. Take fiber supplement OTC daily such as Benefiber, Citrucel or Metamucil. Hemorrhoids seen today.  Avoid constipation!     YOU HAD AN ENDOSCOPIC PROCEDURE TODAY AT [facility]:   Refer to the procedure report that was given to you for any specific questions about what was found during the examination.  If the procedure report does not answer your questions, please call your gastroenterologist to clarify.  If you requested that your care partner not be given the details of your procedure findings, then the procedure report has been included in a sealed envelope for you to review at your convenience later.  YOU SHOULD EXPECT: Some feelings of bloating in the abdomen. Passage of more gas than usual.  Walking can help get rid of the air that was put into your GI tract during the procedure and reduce the bloating. If you had a lower endoscopy (such as a colonoscopy or flexible sigmoidoscopy) you may notice spotting of blood in your stool or on the toilet paper. If you underwent a bowel prep for your procedure, you may not have a normal bowel movement for a few days.  Please Note:  You might notice some irritation and congestion in your nose or some drainage.  This is from the oxygen used during your procedure.  There is no need for concern and it should clear up in a day or so.  SYMPTOMS TO REPORT IMMEDIATELY:   Following lower endoscopy (colonoscopy or flexible sigmoidoscopy):  Excessive amounts of blood in the stool  Significant tenderness or worsening of abdominal pains  Swelling of the abdomen that is new, acute  Fever of 100F or higher   For urgent or emergent issues, a gastroenterologist can be reached at any hour by calling [phone].   DIET:  We do recommend  a small meal at first, but then you may proceed to your regular diet.  Drink plenty of fluids but you should avoid alcoholic beverages for 24 hours.  ACTIVITY:  You should plan to take it easy for the rest of today and you should NOT DRIVE or use heavy machinery until tomorrow (because of the sedation medicines used during the test).    FOLLOW UP: Our staff will call the number listed on your records the next business day following your procedure to check on you and address any questions or concerns that you may have regarding the information given to you following your procedure. If we do not reach you, we will leave a message.  However, if you are feeling well and you are not experiencing any problems, there is no need to return our call.  We will assume that you have returned to your regular daily activities without incident.  If any biopsies were taken you will be contacted by phone or by letter within the next 1-3 weeks.  Please call us at [phone] if you have not heard about the biopsies in 3 weeks.    SIGNATURES/CONFIDENTIALITY: You and/or your care partner have signed paperwork which will be entered into your electronic medical record.  These signatures attest to the fact that that the information above on your After Visit Summary has been reviewed and is understood.  Full responsibility of the confidentiality of this discharge information lies  with you and/or your care-partner.

## 2018-10-19 ENCOUNTER — Telehealth: Payer: Self-pay

## 2018-10-19 NOTE — Telephone Encounter (Signed)
Left message, will call later this afternoon.

## 2018-10-19 NOTE — Telephone Encounter (Signed)
Left message on answering machine. 

## 2018-10-21 ENCOUNTER — Encounter: Payer: Self-pay | Admitting: Gastroenterology

## 2018-12-23 ENCOUNTER — Other Ambulatory Visit: Payer: Self-pay | Admitting: Hematology and Oncology

## 2018-12-28 NOTE — Progress Notes (Signed)
HEMATOLOGY-ONCOLOGY [place] VISIT PROGRESS NOTE  I connected with Betty Foster on 12/30/2018 at  8:45 AM EDT by Webex video conference and verified that I am speaking with the correct person using two identifiers.  I discussed the limitations, risks, security and privacy concerns of performing an evaluation and management service by Webex and the availability of in person appointments.  I also discussed with the patient that there may be a patient responsible charge related to this service. The patient expressed understanding and agreed to proceed.  Patient's Location: Home Physician Location: Home/WebEx  CHIEF COMPLIANT: Follow-up of tamoxifen therapy  INTERVAL HISTORY: Betty Foster is a 53 y.o. 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 I last saw her a year ago. Her most recent mammogram on 07/08/18 showed no evidence of malignancy bilaterally. She presents today over Webex for her annual follow-up.    Breast cancer of upper-outer quadrant of right female breast ([place])   07/02/2014 Initial Diagnosis    Infiltrating lobular carcinoma right breast grade 2    07/17/2014 Breast MRI    Small spiculated mass 1.5 cm and right axillary tail, no lymph nodes, small hepatic cysts probably benign    08/03/2014 Surgery    Right breast lumpectomy: Invasive lobular cancer, grade 2, 1.5 cm, 2 SLN negative, ER/PR positive HER-2 negative, tumor size 1.5 cm, T1 C. N0 M0 stage IA    09/22/2014 - 11/05/2014 Radiation Therapy    XRT    11/30/2014 -  Anti-estrogen oral therapy     tamoxifen 20 mg daily 10 years     REVIEW OF SYSTEMS:   Constitutional: Denies fevers, chills or abnormal weight loss Eyes: Denies blurriness of vision Ears, nose, mouth, throat, and face: Denies mucositis or sore throat Respiratory: Denies cough, dyspnea or wheezes Cardiovascular: Denies  palpitation, chest discomfort Gastrointestinal:  Denies nausea, heartburn or change in bowel habits Skin: Denies abnormal skin rashes Lymphatics: Denies new lymphadenopathy or easy bruising Neurological:Denies numbness, tingling or new weaknesses Behavioral/Psych: Mood is stable, no new changes  Extremities: No lower extremity edema Breast: denies any pain or lumps or nodules in either breasts All other systems were reviewed with the patient and are negative.  Observations/Objective:  There were no vitals filed for this visit. There is no height or weight on file to calculate BMI.  I have reviewed the data as listed CMP Latest Ref Rng & Units 12/31/2014  Glucose 70 - 140 mg/dl 103  BUN 7.0 - 26.0 mg/dL 11.1  Creatinine 0.6 - 1.1 mg/dL 0.8  Sodium 136 - 145 mEq/L 143  Potassium 3.5 - 5.1 mEq/L 4.1  CO2 22 - 29 mEq/L 22  Calcium 8.4 - 10.4 mg/dL 9.4  Total Protein 6.4 - 8.3 g/dL 7.4  Total Bilirubin 0.20 - 1.20 mg/dL 0.69  Alkaline Phos 40 - 150 U/L 46  AST 5 - 34 U/L 11  ALT 0 - 55 U/L 10    Lab Results  Component Value Date   WBC 4.8 12/31/2014   HGB 13.6 12/31/2014   HCT 39.5 12/31/2014   MCV 96.6 12/31/2014   PLT 164 12/31/2014   NEUTROABS 3.8 12/31/2014      Assessment Plan:  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 N0 M0 stage IA; Oncotype DX recurrence score 20, intermediate  risk ROR 13% , tamoxifen started December 2015  Tamoxifen toxicities: Monitoring closely for toxicities . 1. Occasional hot flashes I recommended 10 years of antiestrogen therapy with tamoxifen I renewed her tamoxifen prescription for another year. She just celebrated her 32nd wedding anniversary yesterday. We discussed about precautions during this coronavirus pandemic.  Breast cancer surveillance: 1. Annual mammograms 07/08/18: No abnormalities; Breast density  category B 2. 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 The patient was provided an opportunity to ask questions and all were answered. The patient agreed with the plan and demonstrated an understanding of the instructions. The patient was advised to call back or seek an in-person evaluation if the symptoms worsen or if the condition fails to improve as anticipated.   I provided 15 minutes of face-to-face Web Ex time during this encounter.    Rulon Eisenmenger, MD 12/30/2018    I, Molly Dorshimer, am acting as scribe for Nicholas Lose, MD.  I have reviewed the above documentation for accuracy and completeness, and I agree with the above.

## 2018-12-30 ENCOUNTER — Inpatient Hospital Stay: Payer: PRIVATE HEALTH INSURANCE | Attending: Hematology and Oncology | Admitting: Hematology and Oncology

## 2018-12-30 ENCOUNTER — Telehealth: Payer: Self-pay | Admitting: Hematology and Oncology

## 2018-12-30 DIAGNOSIS — Z170 Estrogen receptor positive status [ER+]: Secondary | ICD-10-CM

## 2018-12-30 DIAGNOSIS — Z923 Personal history of irradiation: Secondary | ICD-10-CM | POA: Diagnosis not present

## 2018-12-30 DIAGNOSIS — Z79810 Long term (current) use of selective estrogen receptor modulators (SERMs): Secondary | ICD-10-CM | POA: Diagnosis not present

## 2018-12-30 DIAGNOSIS — Z85820 Personal history of malignant melanoma of skin: Secondary | ICD-10-CM

## 2018-12-30 DIAGNOSIS — C50411 Malignant neoplasm of upper-outer quadrant of right female breast: Secondary | ICD-10-CM | POA: Diagnosis not present

## 2018-12-30 MED ORDER — TAMOXIFEN CITRATE 20 MG PO TABS: 20.0000 mg | ORAL_TABLET | Freq: Every day | ORAL | 3 refills | Status: DC

## 2018-12-30 NOTE — Telephone Encounter (Signed)
No 4/3 los

## 2018-12-30 NOTE — Assessment & Plan Note (Addendum)
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 N0 M0 stage IA; Oncotype DX recurrence score 20, intermediate risk ROR 13% , tamoxifen started December 2015  Tamoxifen toxicities: Monitoring closely for toxicities . 1. Occasional hot flashes I recommended 10 years of antiestrogen therapy with tamoxifen  Breast cancer surveillance: 1. Annual mammograms 07/08/18: No abnormalities; Breast density category B 2. 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

## 2019-02-16 ENCOUNTER — Encounter: Payer: Self-pay | Admitting: Internal Medicine

## 2019-03-17 ENCOUNTER — Encounter: Payer: Self-pay | Admitting: Internal Medicine

## 2019-03-17 ENCOUNTER — Other Ambulatory Visit: Payer: Self-pay

## 2019-03-17 ENCOUNTER — Ambulatory Visit (INDEPENDENT_AMBULATORY_CARE_PROVIDER_SITE_OTHER): Payer: PRIVATE HEALTH INSURANCE | Admitting: Internal Medicine

## 2019-03-17 VITALS — BP 120/82 | HR 78 | Ht 62.0 in | Wt 133.0 lb

## 2019-03-17 DIAGNOSIS — E039 Hypothyroidism, unspecified: Secondary | ICD-10-CM

## 2019-03-17 DIAGNOSIS — E038 Other specified hypothyroidism: Secondary | ICD-10-CM

## 2019-03-17 MED ORDER — LEVOTHYROXINE SODIUM 50 MCG PO TABS: 50.0000 ug | ORAL_TABLET | Freq: Every day | ORAL | 6 refills | Status: DC

## 2019-03-17 NOTE — Progress Notes (Signed)
Name: Betty Foster  MRN/ DOB: [id number], [birth year]    Age/ Sex: 53 y.o., female    PCP: Quentin Cornwall, MD   Reason for Endocrinology Evaluation: Subclinical ypothyroidism     Date of Initial Endocrinology Evaluation: 03/19/2019     HPI: Betty Foster is a 53 y.o. female with a past medical history of Breast ca  In 2015 ( S/P lumpectomy and radiation). The patient presented for initial endocrinology clinic visit on 03/19/2019 for consultative assistance with her hypothyroidism.   Pt had an abnormal TSH in 07/2018 through her Eda Paschal was found on routine testing during routine lab work (TSH not available to me from November, 2019 but with nomal T4 and T3).  Repeat labs in 01/2019 confirmed an elevated TSH at 6.140 uIU/mL.    Pt initially didn't have any complaints but after reading online about hypothyroidism, she realized she has a lot of the symptoms of hypothyroidism such as weight gain and fatigue.   She sleeps well at night  No constipation  No local neck symptoms  No biotin use   Mother with hypothyroidism     HISTORY:  Past Medical History:  Past Medical History:  Diagnosis Date  . Breast cancer ([place]) 06/2014   Invasive Lobular Carcinoma dx at age 89. Planning lumpectomy on 08/03/2014. Chemotherapy and radiation to be determined.  . Hyperlipemia    Past Surgical History:  Past Surgical History:  Procedure Laterality Date  . BREAST LUMPECTOMY WITH NEEDLE LOCALIZATION AND AXILLARY SENTINEL LYMPH NODE BX Right 08/03/2014   Procedure: NEEDLE LOCALIZED RIGHT BREAST LUMPECTOMY AND RIGHT  SENTINEL LYMPH NODE BX;  Surgeon: Coralie Keens, MD;  Location: [facility];  Service: General;  Laterality: Right;  . NO PAST SURGERIES        Social History:  reports that she has never smoked. She has never used smokeless tobacco. She reports that she does not drink alcohol or use drugs.  Family History: family history includes Cancer (age  of onset: 75) in her maternal grandmother; Cancer (age of onset: 76) in her maternal aunt; Cancer (age of onset: 67) in her father and paternal aunt; Cancer (age of onset: 28) in her maternal grandfather; Cancer (age of onset: 64) in her paternal uncle; Colon cancer in her maternal aunt; Colon polyps in her father; Hypothyroidism in her mother.   HOME MEDICATIONS: Allergies as of 03/17/2019   No Known Allergies     Medication List       Accurate as of March 17, 2019 11:59 PM. If you have any questions, ask your nurse or doctor.        atorvastatin 20 MG tablet Commonly known as: LIPITOR Take 20 mg by mouth daily.   ibuprofen 200 MG tablet Commonly known as: ADVIL Take 200 mg by mouth every 6 (six) hours as needed for mild pain (head ache).   levothyroxine 50 MCG tablet Commonly known as: SYNTHROID Take 1 tablet (50 mcg total) by mouth daily. Started by: Dorita Sciara, MD   tamoxifen 20 MG tablet Commonly known as: NOLVADEX Take 1 tablet (20 mg total) by mouth daily.         REVIEW OF SYSTEMS: A comprehensive ROS was conducted with the patient and is negative except as per HPI and below:  Review of Systems  HENT: Negative for congestion and sore throat.   Respiratory: Negative for cough and shortness of breath.   Cardiovascular: Negative for chest pain and palpitations.  Gastrointestinal: Negative for diarrhea and nausea.  Neurological: Positive for tingling. Negative for tremors.  Psychiatric/Behavioral: Negative for depression. The patient is not nervous/anxious.        OBJECTIVE:  VS: BP 120/82   Pulse 78   Ht 5\' 2"  (1.575 m)   Wt 133 lb (60.3 kg)   LMP 02/10/2019   SpO2 98%   BMI 24.33 kg/m    Wt Readings from Last 3 Encounters:  03/17/19 133 lb (60.3 kg)  10/18/18 131 lb (59.4 kg)  10/06/18 136 lb (61.7 kg)     EXAM: General: Pt appears well and is in NAD  Hydration: Well-hydrated with moist mucous membranes and good skin turgor  Eyes:  External eye exam normal without stare, lid lag or exophthalmos.  EOM intact.   Ears, Nose, Throat: Hearing: Grossly intact bilaterally Dental: Good dentition  Throat: Clear without mass, erythema or exudate  Neck: General: Supple without adenopathy. Thyroid: Thyroid size normal.  No goiter or nodules appreciated. No thyroid bruit.  Lungs: Clear with good BS bilat with no rales, rhonchi, or wheezes  Heart: Auscultation: RRR.  Abdomen: Normoactive bowel sounds, soft, nontender, without masses or organomegaly palpable  Extremities:  BL LE: No pretibial edema normal ROM and strength.  Skin: Hair: Texture and amount normal with gender appropriate distribution Skin Inspection: No rashes Skin Palpation: Skin temperature, texture, and thickness normal to palpation  Neuro: Cranial nerves: II - XII grossly intact  Motor: Normal strength throughout DTRs: 2+ and symmetric in UE without delay in relaxation phase  Mental Status: Judgment, insight: Intact Orientation: Oriented to time, place, and person Mood and affect: No depression, anxiety, or agitation     DATA REVIEWED: 02/16/19 TSH 6.140 uIU/mL     08/16/2018 T3 146 ng/dl (71-180) T4 6.6 ng/dL (4.5-12.0)   Results for Betty Foster, Betty Foster (MRN [id number]) as of 03/19/2019 16:23  Ref. Range 03/17/2019 15:41  TSH Latest Units: mIU/L 10.60 (H)  Triiodothyronine (T3) Latest Ref Range: 76 - 181 ng/dL 143  T4,Free(Direct) Latest Ref Range: 0.8 - 1.8 ng/dL 0.8  Results for Betty Foster, Betty Foster (MRN [id number]) as of 03/20/2019 13:05  Ref. Range 03/17/2019 15:41  Thyroperoxidase Ab SerPl-aCnc Latest Ref Range: <9 IU/mL 10 (H)   ASSESSMENT/PLAN/RECOMMENDATIONS:   1. 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 - Pt encouraged to double dose the following day if she were to miss a dose given long half-life of levothyroxine.   Medications :  Levothyroxine 50 mcg daily    F/u in 2 months   Addendum: TFT results discussed with the patient on 03/20/19 @ 11/20 am     Signed electronically by: Mack Guise, MD  [facility] End[facility]in[facility]  [facility] [address] [place], [place] [address] Phone: [phone] FAX: [phone]   CC: Quentin Cornwall, MD [place], [place] [address] Phone: [phone] Fax: [phone]   Return to Endocrinology clinic as below: Future Appointments  Date Time Provider [place]  05/17/2019  8:10 AM Wayden Schwertner, Melanie Crazier, MD [facility] None

## 2019-03-17 NOTE — Patient Instructions (Signed)

## 2019-03-20 LAB — TSH: TSH: 10.6 mIU/L — ABNORMAL HIGH

## 2019-03-20 LAB — THYROID PEROXIDASE ANTIBODY: Thyroperoxidase Ab SerPl-aCnc: 10 IU/mL — ABNORMAL HIGH (ref <9)

## 2019-03-20 LAB — T3: T3, Total: 143 ng/dL (ref 76–181)

## 2019-03-20 LAB — T4, FREE: Free T4: 0.8 ng/dL (ref 0.8–1.8)

## 2019-04-03 ENCOUNTER — Encounter: Payer: Self-pay | Admitting: *Deleted

## 2019-05-15 ENCOUNTER — Other Ambulatory Visit: Payer: Self-pay

## 2019-05-17 ENCOUNTER — Encounter: Payer: Self-pay | Admitting: Internal Medicine

## 2019-05-17 ENCOUNTER — Other Ambulatory Visit: Payer: Self-pay

## 2019-05-17 ENCOUNTER — Ambulatory Visit (INDEPENDENT_AMBULATORY_CARE_PROVIDER_SITE_OTHER): Payer: PRIVATE HEALTH INSURANCE | Admitting: Internal Medicine

## 2019-05-17 VITALS — BP 110/68 | HR 70 | Temp 98.2°F | Ht 62.0 in | Wt 134.6 lb

## 2019-05-17 DIAGNOSIS — E063 Autoimmune thyroiditis: Secondary | ICD-10-CM | POA: Diagnosis not present

## 2019-05-17 DIAGNOSIS — E038 Other specified hypothyroidism: Secondary | ICD-10-CM | POA: Diagnosis not present

## 2019-05-17 LAB — TSH: TSH: 2.55 u[IU]/mL (ref 0.35–4.50)

## 2019-05-17 LAB — T4, FREE: Free T4: 0.79 ng/dL (ref 0.60–1.60)

## 2019-05-17 MED ORDER — LEVOTHYROXINE SODIUM 50 MCG PO TABS: 50.0000 ug | ORAL_TABLET | Freq: Every day | ORAL | 3 refills | Status: DC

## 2019-05-17 NOTE — Patient Instructions (Signed)

## 2019-05-17 NOTE — Progress Notes (Signed)
Name: Norene Oliveri  MRN/ DOB: [id number], [birth year]    Age/ Sex: 53 y.o., female     PCP: Quentin Cornwall, MD   Reason for Endocrinology Evaluation: Subclinical hypothyroidism     Initial Endocrinology Clinic Visit: 03/19/2019    PATIENT IDENTIFIER: [name] is a 53 y.o., female with a past medical history of Breast ca  In 2015 ( S/P lumpectomy and radiation).  She has followed with [facility] since 03/19/2019 for consultative assistance with management of her Hypothyroidism.   HISTORICAL SUMMARY: The patient was noted to have an elevated TSH in 07/2018 during routine work up at her [place] office. Anti-TPO Abs elevated at 10 IU/mL   LT-4 replacement was started in 02/2019  Mother with hypothyroidism   SUBJECTIVE:   During last visit (03/19/19): Levothyroxine was started. TSH was > 10.0 uIU/mL   Today (05/17/2019):  Ms. Glanz is here for a 2 month follow up on hypothyroidism. She has been compliant with Levothyroxine intake.  Energy level is much better, weight stable  Denies diarrhea and heat intolerance.  Denies depression and anxiety   No local neck symptoms.    ROS:  As per HPI.   HISTORY:  Past Medical History:  Past Medical History:  Diagnosis Date  . Atypical nevus 06/12/2015   severe atypical mole--mid back tx 09/12/2015 excision  . Atypical nevus 12/22/2016   mod-severe mid back tx widershave 01/21/17  . Atypical nevus 01/21/2017   mid back   . Breast cancer ([place]) 06/2014   Invasive Lobular Carcinoma dx at age 39. Planning lumpectomy on 08/03/2014. Chemotherapy and radiation to be determined.  . Hyperlipemia   . Melanoma in situ ([place]) 06/12/2015   left thigh--Tx by Dr. Manley Mason  . Nodulo-ulcerative basal cell carcinoma (BCC) 06/12/2015   Right Chest   Past Surgical History:  Past Surgical History:  Procedure Laterality Date  . BREAST LUMPECTOMY WITH NEEDLE LOCALIZATION AND AXILLARY SENTINEL LYMPH NODE BX Right 08/03/2014   Procedure: NEEDLE LOCALIZED RIGHT BREAST LUMPECTOMY AND RIGHT  SENTINEL LYMPH NODE BX;  Surgeon: Coralie Keens, MD;  Location: [facility];  Service: General;  Laterality: Right;  . NO PAST SURGERIES      Social History:  reports that she has never smoked. She has never used smokeless tobacco. She reports that she does not drink alcohol or use drugs. Family History:  Family History  Problem Relation Age of Onset  . Cancer Father 47       skin cancer, ? if melanoma  . Colon polyps Father   . Hypothyroidism Mother   . Cancer Maternal Aunt 63       colorectal cancer  . Colon cancer Maternal Aunt   . Cancer Paternal Aunt 48       breast cancer  . Cancer Paternal Uncle 57       prostate cancer  . Cancer Maternal Grandmother 55       cervical or uterine cancer  . Cancer Maternal Grandfather 38       lung cancer (smoker)  . Esophageal cancer Neg Hx   . Rectal cancer Neg Hx   . Stomach cancer Neg Hx      HOME MEDICATIONS: Allergies as of 05/17/2019   No Known Allergies     Medication List       Accurate as of May 17, 2019  8:41 AM. If you have any questions, ask your nurse or doctor.  atorvastatin 20 MG tablet Commonly known as: LIPITOR Take 20 mg by mouth daily.   ibuprofen 200 MG tablet Commonly known as: ADVIL Take 200 mg by mouth every 6 (six) hours as needed for mild pain (head ache).   levothyroxine 50 MCG tablet Commonly known as: SYNTHROID Take 1 tablet (50 mcg total) by mouth daily.   tamoxifen 20 MG tablet Commonly known as: NOLVADEX Take 1 tablet (20 mg total) by mouth daily.         OBJECTIVE:   PHYSICAL EXAM: VS: BP 110/68 (BP Location: Left Arm, Patient Position: Sitting, Cuff Size: Normal)   Pulse 70   Temp 98.2 F (36.8 C)   Ht 5\' 2"  (1.575 m)   Wt 134 lb 9.6 oz (61.1 kg)   SpO2 96%   BMI 24.62 kg/m    EXAM: General: Pt appears well and is in NAD  Neck: General: Supple without adenopathy. Thyroid: Thyroid  size normal.  No goiter or nodules appreciated. No thyroid bruit.  Lungs: Clear with good BS bilat with no rales, rhonchi, or wheezes  Heart: Auscultation: RRR.  Abdomen: Normoactive bowel sounds, soft, nontender, without masses or organomegaly palpable  Extremities:  BL LE: No pretibial edema normal ROM and strength.  Skin: Hair: Texture and amount normal with gender appropriate distribution Skin Inspection: No rashes Skin Palpation: Skin temperature, texture, and thickness normal to palpation  Mental Status: Judgment, insight: Intact Orientation: Oriented to time, place, and person Mood and affect: No depression, anxiety, or agitation     DATA REVIEWED:  Results for KAILEN, NAME (MRN [id number]) as of 05/17/2019 12:59  Ref. Range 05/17/2019 08:35  TSH Latest Ref Range: 0.35 - 4.50 uIU/mL 2.55  T4,Free(Direct) Latest Ref Range: 0.60 - 1.60 ng/dL 0.79     ASSESSMENT / PLAN / RECOMMENDATIONS:   1. Hypothyroidism Secondary to Hashimoto's Thyroiditis :   - Clinically she is doing better with energy level.  - Pt educated extensively on the correct way to take levothyroxine (first thing in the morning with water, 30 minutes before eating or taking other medications). - Pt encouraged to double dose the following day if she were to miss a dose given long half-life of levothyroxine.   Medications   Levothyroxine 50 mcg daily   F/U in 6 months    Signed electronically by: Mack Guise, MD  [facility] Endocrinology  [facility] [address] [place], [place] [address] Phone: [phone] FAX: [phone]      CC: Quentin Cornwall, MD [place], [place] [address] Phone: [phone]  Fax: [phone]   Return to Endocrinology clinic as below: Future Appointments  Date Time Provider [place]  11/17/2019  3:40 PM Avian Greenawalt, Melanie Crazier, MD [facility] None

## 2019-06-07 ENCOUNTER — Other Ambulatory Visit: Payer: Self-pay | Admitting: Hematology and Oncology

## 2019-06-07 ENCOUNTER — Other Ambulatory Visit: Payer: Self-pay | Admitting: Obstetrics and Gynecology

## 2019-06-07 DIAGNOSIS — Z1231 Encounter for screening mammogram for malignant neoplasm of breast: Secondary | ICD-10-CM

## 2019-06-07 DIAGNOSIS — Z9889 Other specified postprocedural states: Secondary | ICD-10-CM

## 2019-07-11 ENCOUNTER — Other Ambulatory Visit: Payer: Self-pay | Admitting: Hematology and Oncology

## 2019-07-11 DIAGNOSIS — Z9889 Other specified postprocedural states: Secondary | ICD-10-CM

## 2019-07-13 ENCOUNTER — Other Ambulatory Visit: Payer: Self-pay

## 2019-07-13 ENCOUNTER — Ambulatory Visit
Admission: RE | Admit: 2019-07-13 | Discharge: 2019-07-13 | Disposition: A | Discharge status: Home / Self Care | Payer: PRIVATE HEALTH INSURANCE | Source: Ambulatory Visit | Attending: Obstetrics and Gynecology | Admitting: Obstetrics and Gynecology

## 2019-07-13 DIAGNOSIS — Z9889 Other specified postprocedural states: Secondary | ICD-10-CM

## 2019-07-13 HISTORY — DX: Personal history of irradiation: Z92.3

## 2019-11-17 ENCOUNTER — Other Ambulatory Visit: Payer: Self-pay

## 2019-11-17 ENCOUNTER — Encounter: Payer: Self-pay | Admitting: Internal Medicine

## 2019-11-17 ENCOUNTER — Ambulatory Visit (INDEPENDENT_AMBULATORY_CARE_PROVIDER_SITE_OTHER): Payer: PRIVATE HEALTH INSURANCE | Admitting: Internal Medicine

## 2019-11-17 DIAGNOSIS — E063 Autoimmune thyroiditis: Secondary | ICD-10-CM

## 2019-11-17 DIAGNOSIS — E038 Other specified hypothyroidism: Secondary | ICD-10-CM | POA: Diagnosis not present

## 2019-11-17 MED ORDER — LEVOTHYROXINE SODIUM 50 MCG PO TABS: 50.0000 ug | ORAL_TABLET | Freq: Every day | ORAL | 3 refills | Status: DC

## 2019-11-17 MED ORDER — ATORVASTATIN CALCIUM 20 MG PO TABS: 20.0000 mg | ORAL_TABLET | Freq: Every day | ORAL | 0 refills | Status: DC

## 2019-11-17 NOTE — Progress Notes (Signed)
Virtual Visit via Video Note  I connected with Betty Foster  On 11/17/19 at 3:40 pm  by a video enabled telemedicine application and verified that I am speaking with the correct person using two identifiers.   I discussed the limitations of evaluation and management by telemedicine and the availability of in person appointments. The patient expressed understanding and agreed to proceed.  -Location of the patient : Home  -Location of the provider : Office -The names of all persons participating in the telemedicine service : Pt and myself       Name: Betty Foster  MRN/ DOB: [id number], Jul 29, 1966    Age/ Sex: 54 y.o., female     PCP: Quentin Cornwall, MD   Reason for Endocrinology Evaluation: Subclinical hypothyroidism     Initial Endocrinology Clinic Visit: 03/19/2019    PATIENT IDENTIFIER: [name] is a 54 y.o., female with a past medical history of Breast ca  In 2015 ( S/P lumpectomy and radiation).  She has followed with [facility] since 03/19/2019 for consultative assistance with management of her Hypothyroidism.   HISTORICAL SUMMARY: The patient was noted to have an elevated TSH in 07/2018 during routine work up at her [place] office. Anti-TPO Abs elevated at 10 IU/mL   LT-4 replacement was started in 02/2019  Mother with hypothyroidism   SUBJECTIVE:   During last visit (05/17/19): Levothyroxine continued at 50 mcg daily     Today (11/17/2019):  [name] is here for a 6 month follow up on hypothyroidism. She has been compliant with Levothyroxine intake. No fatigue, fatigue is fluctuating  Denies diarrhea or constipation  Denies depression and anxiety   No local neck symptoms.    ROS:  As per HPI.   HISTORY:  Past Medical History:  Past Medical History:  Diagnosis Date  . Atypical nevus 06/12/2015   severe atypical mole--mid back tx 09/12/2015 excision  . Atypical nevus 12/22/2016   mod-severe mid back tx widershave  01/21/17  . Atypical nevus 01/21/2017   mid back   . Breast cancer ([place]) 06/2014   Invasive Lobular Carcinoma dx at age 44. Planning lumpectomy on 08/03/2014. Chemotherapy and radiation to be determined.  . Hyperlipemia   . Melanoma in situ ([place]) 06/12/2015   left thigh--Tx by Dr. Manley Mason  . Nodulo-ulcerative basal cell carcinoma (BCC) 06/12/2015   Right Chest  . Personal history of radiation therapy    Past Surgical History:  Past Surgical History:  Procedure Laterality Date  . BREAST LUMPECTOMY Right 07/2014  . BREAST LUMPECTOMY WITH NEEDLE LOCALIZATION AND AXILLARY SENTINEL LYMPH NODE BX Right 08/03/2014   Procedure: NEEDLE LOCALIZED RIGHT BREAST LUMPECTOMY AND RIGHT  SENTINEL LYMPH NODE BX;  Surgeon: Coralie Keens, MD;  Location: [facility];  Service: General;  Laterality: Right;  . NO PAST SURGERIES      Social History:  reports that she has never smoked. She has never used smokeless tobacco. She reports that she does not drink alcohol or use drugs. Family History:  Family History  Problem Relation Age of Onset  . Cancer Father 46       skin cancer, ? if melanoma  . Colon polyps Father   . Hypothyroidism Mother   . Cancer Maternal Aunt 63       colorectal cancer  . Colon cancer Maternal Aunt   . Cancer Paternal Aunt 55       breast cancer  . Cancer Paternal Uncle 46  prostate cancer  . Cancer Maternal Grandmother 55       cervical or uterine cancer  . Cancer Maternal Grandfather 90       lung cancer (smoker)  . Esophageal cancer Neg Hx   . Rectal cancer Neg Hx   . Stomach cancer Neg Hx      HOME MEDICATIONS: Allergies as of 11/17/2019   No Known Allergies     Medication List       Accurate as of November 17, 2019 10:42 AM. If you have any questions, ask your nurse or doctor.        atorvastatin 20 MG tablet Commonly known as: LIPITOR Take 20 mg by mouth daily.   ibuprofen 200 MG tablet Commonly known as: ADVIL Take 200 mg by  mouth every 6 (six) hours as needed for mild pain (head ache).   levothyroxine 50 MCG tablet Commonly known as: SYNTHROID Take 1 tablet (50 mcg total) by mouth daily.   tamoxifen 20 MG tablet Commonly known as: NOLVADEX Take 1 tablet (20 mg total) by mouth daily.         DATA REVIEWED:  Results for BINH, FURIA (MRN [id number]) as of 05/17/2019 12:59  Ref. Range 05/17/2019 08:35  TSH Latest Ref Range: 0.35 - 4.50 uIU/mL 2.55  T4,Free(Direct) Latest Ref Range: 0.60 - 1.60 ng/dL 0.79   ASSESSMENT / PLAN / RECOMMENDATIONS:   1. Hypothyroidism Secondary to Hashimoto's Thyroiditis :   - Clinically she is euthyroid  - No local neck symptoms - Pt educated extensively on the correct way to take levothyroxine (first thing in the morning with water, 30 minutes before eating or taking other medications). - Pt encouraged to double dose the following day if she were to miss a dose given long half-life of levothyroxine. -Patient to have TFTs rechecked either at our office or PCPs office.  Medications   Levothyroxine 50 mcg daily   2. Dyslipidemia:    -Patient requested a refill on atorvastatin as she is running out, her current PCP has moved. -We will refill for 3 months.   F/U in 1 yr    Signed electronically by: Mack Guise, MD  [facility] Endocrin[facility]  [facility] [address] [place], [place] [address] Phone: [phone] FAX: [phone]      CC: Quentin Cornwall, MD [place], [place] [address] Phone: [phone]  Fax: [phone]   Return to Endocrinology clinic as below: Future Appointments  Date Time Provider [place]  11/17/2019  3:40 PM Digby Groeneveld, Melanie Crazier, MD [facility] None

## 2020-01-22 ENCOUNTER — Other Ambulatory Visit: Payer: Self-pay | Admitting: Hematology and Oncology

## 2020-01-23 NOTE — Progress Notes (Signed)
Patient Care Team: Quentin Cornwall, MD as PCP - General (Family Medicine)  DIAGNOSIS:    ICD-10-CM   1. 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 N0 M0 stage IA   09/22/2014 - 11/05/2014 Radiation Therapy   XRT   11/30/2014 -  Anti-estrogen oral therapy    tamoxifen 20 mg daily 10 years     CHIEF COMPLIANT: Follow-up of right breast cancer on tamoxifen therapy  INTERVAL HISTORY: Betty Foster is a 54 y.o. with above-mentioned history of right breast cancer treated with lumpectomy, radiation, and who is currently on tamoxifen therapy. Mammogram on 07/13/19 showed no evidence of malignancy bilaterally. She presents to the clinic today for  annual follow-up.  ALLERGIES:  has No Known Allergies.  MEDICATIONS:  Current Outpatient Medications  Medication Sig Dispense Refill  . atorvastatin (LIPITOR) 20 MG tablet Take 1 tablet (20 mg total) by mouth daily. 90 tablet 0  . ibuprofen (ADVIL,MOTRIN) 200 MG tablet Take 200 mg by mouth every 6 (six) hours as needed for mild pain (head ache).    Marland Kitchen levothyroxine (SYNTHROID) 50 MCG tablet Take 1 tablet (50 mcg total) by mouth daily. 90 tablet 3  . tamoxifen (NOLVADEX) 20 MG tablet TAKE 1 TABLET BY MOUTH EVERY DAY 90 tablet 3   Current Facility-Administered Medications  Medication Dose Route Frequency Provider Last Rate Last Admin  . 0.9 %  sodium chloride infusion  500  mL Intravenous Once Mansouraty, Telford Nab., MD        PHYSICAL EXAMINATION: ECOG PERFORMANCE STATUS: 1 - Symptomatic but completely ambulatory  There were no vitals filed for this visit. There were no vitals filed for this visit.  BREAST: No palpable masses or nodules in either right or left breasts. No palpable axillary supraclavicular or infraclavicular adenopathy no breast tenderness or nipple discharge. (exam performed in the presence of a chaperone)  LABORATORY DATA:  I have reviewed the data as listed CMP Latest Ref Rng & Units 12/31/2014  Glucose 70 - 140 mg/dl 103  BUN 7.0 - 26.0 mg/dL 11.1  Creatinine 0.6 - 1.1 mg/dL 0.8  Sodium 136 - 145 mEq/L 143  Potassium 3.5 - 5.1 mEq/L 4.1  CO2 22 - 29 mEq/L 22  Calcium 8.4 - 10.4 mg/dL 9.4  Total Protein 6.4 - 8.3 g/dL 7.4  Total Bilirubin 0.20 - 1.20 mg/dL 0.69  Alkaline Phos 40 - 150 U/L 46  AST 5 - 34 U/L 11  ALT 0 - 55 U/L 10    Lab Results  Component Value Date   WBC 4.8 12/31/2014   HGB 13.6 12/31/2014   HCT 39.5 12/31/2014   MCV 96.6 12/31/2014   PLT 164 12/31/2014   NEUTROABS 3.8 12/31/2014    ASSESSMENT & PLAN:  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 N0  M0 stage IA; Oncotype DX recurrence score 20, intermediate risk ROR 13% , tamoxifen started December 2015  Tamoxifen toxicities: Monitoring closely for toxicities . 1. Occasional hot flashes Otherwise tolerating it well. Plan to treat her with tamoxifen for 10 years  I renewed her tamoxifen prescription for another year.  Breast cancer surveillance: 1. Annual mammograms10/15/20: No abnormalities; Breast density category B 2. 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  The patient has a good understanding of the overall plan. she agrees with it. she will call with any problems that may develop before the next visit here.  Total time spent: 20 mins including face to face time and time spent for planning, charting and coordination of care  Nicholas Lose, MD 01/24/2020  I, Cloyde Reams Dorshimer, am acting as scribe for Dr. Nicholas Lose.  I have reviewed the above documentation for accuracy and completeness, and I agree with the above.

## 2020-01-24 ENCOUNTER — Inpatient Hospital Stay: Payer: PRIVATE HEALTH INSURANCE | Attending: Hematology and Oncology | Admitting: Hematology and Oncology

## 2020-01-24 ENCOUNTER — Other Ambulatory Visit: Payer: Self-pay

## 2020-01-24 DIAGNOSIS — C50411 Malignant neoplasm of upper-outer quadrant of right female breast: Secondary | ICD-10-CM | POA: Diagnosis not present

## 2020-01-24 DIAGNOSIS — Z170 Estrogen receptor positive status [ER+]: Secondary | ICD-10-CM | POA: Diagnosis not present

## 2020-01-24 DIAGNOSIS — Z923 Personal history of irradiation: Secondary | ICD-10-CM | POA: Diagnosis not present

## 2020-01-24 DIAGNOSIS — R232 Flushing: Secondary | ICD-10-CM | POA: Insufficient documentation

## 2020-01-24 DIAGNOSIS — N951 Menopausal and female climacteric states: Secondary | ICD-10-CM | POA: Insufficient documentation

## 2020-01-24 DIAGNOSIS — C4370 Malignant melanoma of unspecified lower limb, including hip: Secondary | ICD-10-CM | POA: Diagnosis not present

## 2020-01-24 DIAGNOSIS — Z79899 Other long term (current) drug therapy: Secondary | ICD-10-CM | POA: Diagnosis not present

## 2020-01-24 DIAGNOSIS — Z79810 Long term (current) use of selective estrogen receptor modulators (SERMs): Secondary | ICD-10-CM | POA: Diagnosis not present

## 2020-01-24 NOTE — Assessment & Plan Note (Signed)
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 N0 M0 stage IA; Oncotype DX recurrence score 20, intermediate risk ROR 13% , tamoxifen started December 2015  Tamoxifen toxicities: Monitoring closely for toxicities . 1. Occasional hot flashes I recommended 10 years of antiestrogen therapy with tamoxifen I renewed her tamoxifen prescription for another year.  Breast cancer surveillance: 1. Annual mammograms10/15/20: No abnormalities; Breast density category B 2. 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

## 2020-02-11 ENCOUNTER — Other Ambulatory Visit: Payer: Self-pay | Admitting: Internal Medicine

## 2020-05-15 ENCOUNTER — Ambulatory Visit (INDEPENDENT_AMBULATORY_CARE_PROVIDER_SITE_OTHER): Payer: PRIVATE HEALTH INSURANCE | Admitting: Family Medicine

## 2020-05-15 ENCOUNTER — Other Ambulatory Visit: Payer: Self-pay

## 2020-05-15 ENCOUNTER — Encounter: Payer: Self-pay | Admitting: Family Medicine

## 2020-05-15 VITALS — BP 130/90 | HR 58 | Temp 98.2°F | Ht 62.0 in | Wt 131.9 lb

## 2020-05-15 DIAGNOSIS — C50411 Malignant neoplasm of upper-outer quadrant of right female breast: Secondary | ICD-10-CM

## 2020-05-15 DIAGNOSIS — Z23 Encounter for immunization: Secondary | ICD-10-CM | POA: Diagnosis not present

## 2020-05-15 DIAGNOSIS — E038 Other specified hypothyroidism: Secondary | ICD-10-CM

## 2020-05-15 DIAGNOSIS — Z13220 Encounter for screening for lipoid disorders: Secondary | ICD-10-CM

## 2020-05-15 DIAGNOSIS — Z131 Encounter for screening for diabetes mellitus: Secondary | ICD-10-CM

## 2020-05-15 DIAGNOSIS — Z170 Estrogen receptor positive status [ER+]: Secondary | ICD-10-CM

## 2020-05-15 DIAGNOSIS — E063 Autoimmune thyroiditis: Secondary | ICD-10-CM

## 2020-05-15 NOTE — Progress Notes (Signed)
Betty Foster DOB: 05-Jan-1966 Encounter date: 05/15/2020  This isa 54 y.o. female who presents to establish care. Chief Complaint  Patient presents with  . Establish Care    History of present illness: Routine mammogram 2015 and was sent back, biopsy, dx with cancer right side. Lumpectomy completed at that time. 33 rounds of radiation. Has been on tamoxifen for 5 years with 10 year regimen. Does ok with tamoxifen.   No specific concerns today.   Does see gynecology regularly. Thyroid abnormality noted on bloodwork with gyn last year. Started synthroid last year and doing well with this.   Energy level is decent. Doesn't exercise regularly. Walks some. Swims at home pool.   Had melanoma on leg. Hasn't seen derm in 2 years, but usually follows regularly.   Has seen Dr. Ninfa Linden in past as well; but no longer needs follow up.    Past Medical History:  Diagnosis Date  . Atypical nevus 06/12/2015   severe atypical mole--mid back tx 09/12/2015 excision  . Atypical nevus 12/22/2016   mod-severe mid back tx widershave 01/21/17  . Atypical nevus 01/21/2017   mid back   . Breast cancer ([place]) 06/2014   Invasive Lobular Carcinoma dx at age 75. Planning lumpectomy on 08/03/2014. Chemotherapy and radiation to be determined.  Marland Kitchen History of chicken pox   . Hyperlipemia   . Melanoma in situ ([place]) 06/12/2015   left thigh--Tx by Dr. Manley Mason  . Nodulo-ulcerative basal cell carcinoma (BCC) 06/12/2015   Right Chest  . Personal history of radiation therapy   . Thyroid disease    Past Surgical History:  Procedure Laterality Date  . BREAST LUMPECTOMY Right 07/2014  . BREAST LUMPECTOMY WITH NEEDLE LOCALIZATION AND AXILLARY SENTINEL LYMPH NODE BX Right 08/03/2014   Procedure: NEEDLE LOCALIZED RIGHT BREAST LUMPECTOMY AND RIGHT  SENTINEL LYMPH NODE BX;  Surgeon: Coralie Keens, MD;  Location: [place];  Service: General;  Laterality: Right;  . NO PAST SURGERIES     No Known  Allergies Current Meds  Medication Sig  . tamoxifen (NOLVADEX) 20 MG tablet TAKE 1 TABLET BY MOUTH EVERY DAY  . [DISCONTINUED] atorvastatin (LIPITOR) 20 MG tablet TAKE 1 TABLET BY MOUTH EVERY DAY  . [DISCONTINUED] levothyroxine (SYNTHROID) 50 MCG tablet Take 1 tablet (50 mcg total) by mouth daily.   Current Facility-Administered Medications for the 05/15/20 encounter (Office Visit) with Caren Macadam, MD  Medication  . 0.9 %  sodium chloride infusion   Social History   Tobacco Use  . Smoking status: Never Smoker  . Smokeless tobacco: Never Used  Substance Use Topics  . Alcohol use: Yes    Comment: socially   Family History  Problem Relation Age of Onset  . Cancer Father 58       skin cancer, ? if melanoma  . Colon polyps Father   . Arthritis Father        ?rheumatoid  . Diabetes Father   . Hyperlipidemia Father   . Hypertension Father   . Hypothyroidism Mother   . High blood pressure Mother   . High Cholesterol Mother   . Cancer Maternal Aunt 63       colorectal cancer  . Colon cancer Maternal Aunt   . Cancer Paternal Aunt 83       breast cancer  . Cancer Paternal Uncle 66       prostate cancer  . Cancer Maternal Grandmother 55       cervical or uterine cancer  .  Cancer Maternal Grandfather 77       lung cancer (smoker)  . Depression Daughter   . Hyperlipidemia Daughter   . Hyperlipidemia Paternal Grandmother   . CVA Paternal Grandmother 27  . Heart attack Paternal Grandfather 48  . Hyperlipidemia Paternal Grandfather   . Hearing loss Paternal Grandfather   . Healthy Daughter   . Esophageal cancer Neg Hx   . Rectal cancer Neg Hx   . Stomach cancer Neg Hx      Review of Systems  Constitutional: Negative for chills, fatigue and fever.  Respiratory: Negative for cough, chest tightness, shortness of breath and wheezing.   Cardiovascular: Negative for chest pain, palpitations and leg swelling.    Objective:  BP 130/90 (BP Location: Left Arm, Patient  Position: Sitting, Cuff Size: Normal)   Pulse (!) 58   Temp 98.2 F (36.8 C) (Oral)   Ht 5\' 2"  (1.575 m)   Wt 131 lb 14.4 oz (59.8 kg)   LMP 05/03/2019 (Exact Date)   SpO2 99%   BMI 24.12 kg/m   Weight: 131 lb 14.4 oz (59.8 kg)   BP Readings from Last 3 Encounters:  05/15/20 130/90  01/24/20 (!) 148/93  05/17/19 110/68   Wt Readings from Last 3 Encounters:  05/15/20 131 lb 14.4 oz (59.8 kg)  01/24/20 135 lb 9.6 oz (61.5 kg)  05/17/19 134 lb 9.6 oz (61.1 kg)    Physical Exam Constitutional:      General: She is not in acute distress.    Appearance: She is well-developed.  Cardiovascular:     Rate and Rhythm: Normal rate and regular rhythm.     Heart sounds: Normal heart sounds. No murmur heard.  No friction rub.  Pulmonary:     Effort: Pulmonary effort is normal. No respiratory distress.     Breath sounds: Normal breath sounds. No wheezing or rales.  Musculoskeletal:     Right lower leg: No edema.     Left lower leg: No edema.  Neurological:     Mental Status: She is alert and oriented to person, place, and time.  Psychiatric:        Behavior: Behavior normal.     Assessment/Plan:  1. Hypothyroidism due to Hashimoto's thyroiditis We will recheck thyroid labs today.  Refill pending results. - TSH; Future - T4, free; Future - T4, free - TSH  2. Malignant neoplasm of upper-outer quadrant of right breast in female, estrogen receptor positive ([place]) Doing well with tamoxifen.  Follows up with oncology regularly. - CBC with Differential/Platelet; Future - Comprehensive metabolic panel; Future - Comprehensive metabolic panel - CBC with Differential/Platelet  3. Lipid screening Cholesterol treated with Lipitor.  Consider dose adjustment pending lab results. - Lipid panel; Future - Lipid panel  4. Screening for diabetes mellitus - Comprehensive metabolic panel; Future - Comprehensive metabolic panel  5. Need for shingles vaccine - Varicella-zoster vaccine IM  (Shingrix)  Return in about 6 months (around 11/15/2020) for physical exam.  Micheline Rough, MD

## 2020-05-16 ENCOUNTER — Other Ambulatory Visit: Payer: Self-pay | Admitting: Family Medicine

## 2020-05-16 LAB — COMPREHENSIVE METABOLIC PANEL
AG Ratio: 1.6 (calc) (ref 1.0–2.5)
ALT: 17 U/L (ref 6–29)
AST: 18 U/L (ref 10–35)
Albumin: 4.6 g/dL (ref 3.6–5.1)
Alkaline phosphatase (APISO): 70 U/L (ref 37–153)
BUN: 17 mg/dL (ref 7–25)
CO2: 30 mmol/L (ref 20–32)
Calcium: 10.2 mg/dL (ref 8.6–10.4)
Chloride: 103 mmol/L (ref 98–110)
Creat: 0.87 mg/dL (ref 0.50–1.05)
Globulin: 2.8 g/dL (calc) (ref 1.9–3.7)
Glucose, Bld: 92 mg/dL (ref 65–99)
Potassium: 5 mmol/L (ref 3.5–5.3)
Sodium: 142 mmol/L (ref 135–146)
Total Bilirubin: 0.6 mg/dL (ref 0.2–1.2)
Total Protein: 7.4 g/dL (ref 6.1–8.1)

## 2020-05-16 LAB — CBC WITH DIFFERENTIAL/PLATELET
Absolute Monocytes: 462 cells/uL (ref 200–950)
Basophils Absolute: 41 cells/uL (ref 0–200)
Basophils Relative: 0.6 %
Eosinophils Absolute: 48 cells/uL (ref 15–500)
Eosinophils Relative: 0.7 %
HCT: 42.1 % (ref 35.0–45.0)
Hemoglobin: 14.1 g/dL (ref 11.7–15.5)
Lymphs Abs: 2450 cells/uL (ref 850–3900)
MCH: 31.4 pg (ref 27.0–33.0)
MCHC: 33.5 g/dL (ref 32.0–36.0)
MCV: 93.8 fL (ref 80.0–100.0)
MPV: 11.9 fL (ref 7.5–12.5)
Monocytes Relative: 6.7 %
Neutro Abs: 3899 cells/uL (ref 1500–7800)
Neutrophils Relative %: 56.5 %
Platelets: 210 10*3/uL (ref 140–400)
RBC: 4.49 10*6/uL (ref 3.80–5.10)
RDW: 12.8 % (ref 11.0–15.0)
Total Lymphocyte: 35.5 %
WBC: 6.9 10*3/uL (ref 3.8–10.8)

## 2020-05-16 LAB — LIPID PANEL
Cholesterol: 183 mg/dL (ref <200)
HDL: 63 mg/dL (ref >=50)
LDL Cholesterol (Calc): 90 mg/dL (calc)
Non-HDL Cholesterol (Calc): 120 mg/dL (calc) (ref <130)
Total CHOL/HDL Ratio: 2.9 (calc) (ref <5.0)
Triglycerides: 209 mg/dL — ABNORMAL HIGH (ref <150)

## 2020-05-16 LAB — T4, FREE: Free T4: 1 ng/dL (ref 0.8–1.8)

## 2020-05-16 LAB — TSH: TSH: 3.08 mIU/L

## 2020-05-16 MED ORDER — LEVOTHYROXINE SODIUM 50 MCG PO TABS: 50.0000 ug | ORAL_TABLET | Freq: Every day | ORAL | 3 refills | Status: DC

## 2020-05-16 MED ORDER — ATORVASTATIN CALCIUM 20 MG PO TABS: 20.0000 mg | ORAL_TABLET | Freq: Every day | ORAL | 3 refills | Status: DC

## 2020-06-12 ENCOUNTER — Other Ambulatory Visit: Payer: Self-pay | Admitting: Obstetrics and Gynecology

## 2020-06-12 DIAGNOSIS — Z1231 Encounter for screening mammogram for malignant neoplasm of breast: Secondary | ICD-10-CM

## 2020-07-16 ENCOUNTER — Other Ambulatory Visit: Payer: Self-pay

## 2020-07-16 ENCOUNTER — Ambulatory Visit
Admission: RE | Admit: 2020-07-16 | Discharge: 2020-07-16 | Disposition: A | Discharge status: Home / Self Care | Payer: PRIVATE HEALTH INSURANCE | Source: Ambulatory Visit | Attending: Obstetrics and Gynecology | Admitting: Obstetrics and Gynecology

## 2020-07-16 DIAGNOSIS — Z1231 Encounter for screening mammogram for malignant neoplasm of breast: Secondary | ICD-10-CM

## 2020-07-30 ENCOUNTER — Ambulatory Visit (INDEPENDENT_AMBULATORY_CARE_PROVIDER_SITE_OTHER): Payer: PRIVATE HEALTH INSURANCE | Admitting: *Deleted

## 2020-07-30 ENCOUNTER — Other Ambulatory Visit: Payer: Self-pay

## 2020-07-30 DIAGNOSIS — Z23 Encounter for immunization: Secondary | ICD-10-CM

## 2020-07-30 NOTE — Progress Notes (Signed)
Patient in office today for 2nd Shingrix vaccine. Vaccine administered with no immediate reaction.

## 2020-08-28 ENCOUNTER — Encounter: Payer: PRIVATE HEALTH INSURANCE | Admitting: Family Medicine

## 2020-12-11 ENCOUNTER — Telehealth: Payer: Self-pay | Admitting: Hematology and Oncology

## 2020-12-11 NOTE — Telephone Encounter (Signed)
Rescheduled 4/28 appt, called and spoke with pt, confirmed 5/5 appt

## 2021-01-12 ENCOUNTER — Other Ambulatory Visit: Payer: Self-pay | Admitting: Hematology and Oncology

## 2021-01-23 ENCOUNTER — Inpatient Hospital Stay: Payer: PRIVATE HEALTH INSURANCE | Admitting: Hematology and Oncology

## 2021-01-29 NOTE — Assessment & Plan Note (Signed)
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 N0 M0 stage IA; Oncotype DX recurrence score 20, intermediate risk ROR 13% , tamoxifen started December 2015  Tamoxifen toxicities: Monitoring closely for toxicities . 1. Occasional hot flashes Otherwise tolerating it well. Plan to treat her with tamoxifen for 10 years  I renewed her tamoxifen prescription for another year.  Breast cancer surveillance: 1. Annual mammograms10/21/2021: No abnormalities; Breast density category B 2. 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

## 2021-01-29 NOTE — Progress Notes (Signed)
Patient Care Team: Caren Macadam, MD as PCP - General (Family Medicine) Nicholas Lose, MD as Consulting Physician (Hematology and Oncology) [facility] (Dermatology) Hoyt Koch, MD as Referring Physician (Obstetrics and Gynecology)  DIAGNOSIS:    ICD-10-CM   1. 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 N0 M0 stage IA   09/22/2014 - 11/05/2014 Radiation Therapy   XRT   11/30/2014 -  Anti-estrogen oral therapy    tamoxifen 20 mg daily 10 years     CHIEF COMPLIANT: Follow-up of right breast cancer on tamoxifen therapy  INTERVAL HISTORY: Betty Foster is a 55 y.o. with above-mentioned history of right breast cancer treated with lumpectomy, radiation, and who is currently on tamoxifen therapy. Mammogram on 07/18/20 showed no evidence of malignancy bilaterally.She presents to the clinic today forannual follow-up.  Patient is tolerating tamoxifen extremely well without any major problems.  She does have occasional hot flashes.  Denies any lumps or nodules in the breast.  Occasional numbness and tingling sensation in the axilla.  ALLERGIES:  has No Known Allergies.  MEDICATIONS:  Current Outpatient Medications  Medication Sig Dispense Refill  . atorvastatin (LIPITOR) 20 MG tablet Take 1 tablet (20 mg total) by mouth daily. 90 tablet 3  . levothyroxine (SYNTHROID) 50 MCG tablet Take 1  tablet (50 mcg total) by mouth daily. 90 tablet 3  . tamoxifen (NOLVADEX) 20 MG tablet TAKE 1 TABLET BY MOUTH EVERY DAY 90 tablet 3   Current Facility-Administered Medications  Medication Dose Route Frequency Provider Last Rate Last Admin  . 0.9 %  sodium chloride infusion  500 mL Intravenous Once Mansouraty, Telford Nab., MD        PHYSICAL EXAMINATION: ECOG PERFORMANCE STATUS: 1 - Symptomatic but completely ambulatory  Vitals:   01/30/21 0836  BP: (!) 155/96  Pulse: 62  Resp: 18  Temp: 98.1 F (36.7 C)  SpO2: 99%   Filed Weights   01/30/21 0836  Weight: 138 lb 3.2 oz (62.7 kg)    BREAST: No palpable masses or nodules in either right or left breasts. No palpable axillary supraclavicular or infraclavicular adenopathy no breast tenderness or nipple discharge. (exam performed in the presence of a chaperone)  LABORATORY DATA:  I have reviewed the data as listed CMP Latest Ref Rng & Units 05/15/2020 12/31/2014  Glucose 65 - 99 mg/dL 92 103  BUN 7 - 25 mg/dL 17 11.1  Creatinine 0.50 - 1.05 mg/dL 0.87 0.8  Sodium 135 - 146 mmol/L 142 143  Potassium 3.5 - 5.3 mmol/L 5.0 4.1  Chloride 98 - 110 mmol/L 103 -  CO2 20 - 32 mmol/L 30 22  Calcium 8.6 - 10.4 mg/dL 10.2 9.4  Total Protein 6.1 - 8.1 g/dL 7.4 7.4  Total Bilirubin 0.2 - 1.2 mg/dL 0.6 0.69  Alkaline Phos 40 - 150 U/L - 46  AST 10 - 35 U/L 18 11  ALT 6 -  29 U/L 17 10    Lab Results  Component Value Date   WBC 6.9 05/15/2020   HGB 14.1 05/15/2020   HCT 42.1 05/15/2020   MCV 93.8 05/15/2020   PLT 210 05/15/2020   NEUTROABS 3,899 05/15/2020    ASSESSMENT & PLAN:  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 N0 M0 stage IA; Oncotype DX recurrence score 20, intermediate risk ROR 13% , tamoxifen started December 2015  Tamoxifen toxicities: Monitoring closely for toxicities . 1. Occasional hot  flashes Otherwise tolerating it well. Plan to treat her with tamoxifen for 10 years  Breast cancer surveillance: 1. Annual mammograms10/21/2021: No abnormalities; Breast density category B 2. 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  The patient has a good understanding of the overall plan. she agrees with it. she will call with any problems that may develop before the next visit here.  Total time spent: 20 mins including face to face time and time spent for planning, charting and coordination of care  Rulon Eisenmenger, MD, MPH 01/30/2021  I, Cloyde Reams Dorshimer, am acting as scribe for Dr. Nicholas Lose.  I have reviewed the above documentation for accuracy and completeness, and I agree with the above.

## 2021-01-30 ENCOUNTER — Other Ambulatory Visit: Payer: Self-pay

## 2021-01-30 ENCOUNTER — Inpatient Hospital Stay: Payer: PRIVATE HEALTH INSURANCE | Attending: Hematology and Oncology | Admitting: Hematology and Oncology

## 2021-01-30 DIAGNOSIS — Z79899 Other long term (current) drug therapy: Secondary | ICD-10-CM | POA: Diagnosis not present

## 2021-01-30 DIAGNOSIS — Z170 Estrogen receptor positive status [ER+]: Secondary | ICD-10-CM | POA: Diagnosis not present

## 2021-01-30 DIAGNOSIS — Z79810 Long term (current) use of selective estrogen receptor modulators (SERMs): Secondary | ICD-10-CM | POA: Insufficient documentation

## 2021-01-30 DIAGNOSIS — Z923 Personal history of irradiation: Secondary | ICD-10-CM | POA: Insufficient documentation

## 2021-01-30 DIAGNOSIS — C50411 Malignant neoplasm of upper-outer quadrant of right female breast: Secondary | ICD-10-CM | POA: Diagnosis not present

## 2021-05-16 ENCOUNTER — Ambulatory Visit (INDEPENDENT_AMBULATORY_CARE_PROVIDER_SITE_OTHER): Payer: PRIVATE HEALTH INSURANCE | Admitting: Family Medicine

## 2021-05-16 ENCOUNTER — Telehealth: Payer: Self-pay | Admitting: Family Medicine

## 2021-05-16 ENCOUNTER — Encounter: Payer: Self-pay | Admitting: Family Medicine

## 2021-05-16 ENCOUNTER — Other Ambulatory Visit: Payer: Self-pay

## 2021-05-16 VITALS — BP 120/84 | HR 66 | Temp 97.9°F | Ht 61.5 in | Wt 133.8 lb

## 2021-05-16 DIAGNOSIS — E038 Other specified hypothyroidism: Secondary | ICD-10-CM

## 2021-05-16 DIAGNOSIS — Z131 Encounter for screening for diabetes mellitus: Secondary | ICD-10-CM | POA: Diagnosis not present

## 2021-05-16 DIAGNOSIS — Z13220 Encounter for screening for lipoid disorders: Secondary | ICD-10-CM

## 2021-05-16 DIAGNOSIS — C50411 Malignant neoplasm of upper-outer quadrant of right female breast: Secondary | ICD-10-CM

## 2021-05-16 DIAGNOSIS — E063 Autoimmune thyroiditis: Secondary | ICD-10-CM | POA: Diagnosis not present

## 2021-05-16 DIAGNOSIS — D179 Benign lipomatous neoplasm, unspecified: Secondary | ICD-10-CM

## 2021-05-16 DIAGNOSIS — Z0000 Encounter for general adult medical examination without abnormal findings: Secondary | ICD-10-CM

## 2021-05-16 DIAGNOSIS — Z170 Estrogen receptor positive status [ER+]: Secondary | ICD-10-CM

## 2021-05-16 DIAGNOSIS — M7989 Other specified soft tissue disorders: Secondary | ICD-10-CM | POA: Diagnosis not present

## 2021-05-16 DIAGNOSIS — E785 Hyperlipidemia, unspecified: Secondary | ICD-10-CM

## 2021-05-16 LAB — COMPREHENSIVE METABOLIC PANEL
ALT: 15 U/L (ref 0–35)
AST: 15 U/L (ref 0–37)
Albumin: 4.3 g/dL (ref 3.5–5.2)
Alkaline Phosphatase: 68 U/L (ref 39–117)
BUN: 20 mg/dL (ref 6–23)
CO2: 28 mEq/L (ref 19–32)
Calcium: 9.6 mg/dL (ref 8.4–10.5)
Chloride: 105 mEq/L (ref 96–112)
Creatinine, Ser: 0.9 mg/dL (ref 0.40–1.20)
GFR: 73.12 mL/min (ref >60.00)
Glucose, Bld: 102 mg/dL — ABNORMAL HIGH (ref 70–99)
Potassium: 4.1 mEq/L (ref 3.5–5.1)
Sodium: 142 mEq/L (ref 135–145)
Total Bilirubin: 0.6 mg/dL (ref 0.2–1.2)
Total Protein: 7.1 g/dL (ref 6.0–8.3)

## 2021-05-16 LAB — CBC WITH DIFFERENTIAL/PLATELET
Basophils Absolute: 0 10*3/uL (ref 0.0–0.1)
Basophils Relative: 0.8 % (ref 0.0–3.0)
Eosinophils Absolute: 0.1 10*3/uL (ref 0.0–0.7)
Eosinophils Relative: 1.4 % (ref 0.0–5.0)
HCT: 37.2 % (ref 36.0–46.0)
Hemoglobin: 12.6 g/dL (ref 12.0–15.0)
Lymphocytes Relative: 35.5 % (ref 12.0–46.0)
Lymphs Abs: 1.8 10*3/uL (ref 0.7–4.0)
MCHC: 33.7 g/dL (ref 30.0–36.0)
MCV: 91.9 fl (ref 78.0–100.0)
Monocytes Absolute: 0.3 10*3/uL (ref 0.1–1.0)
Monocytes Relative: 6.6 % (ref 3.0–12.0)
Neutro Abs: 2.8 10*3/uL (ref 1.4–7.7)
Neutrophils Relative %: 55.7 % (ref 43.0–77.0)
Platelets: 186 10*3/uL (ref 150.0–400.0)
RBC: 4.05 Mil/uL (ref 3.87–5.11)
RDW: 13.5 % (ref 11.5–15.5)
WBC: 5 10*3/uL (ref 4.0–10.5)

## 2021-05-16 LAB — LIPID PANEL
Cholesterol: 168 mg/dL (ref 0–200)
HDL: 53.1 mg/dL (ref >39.00)
LDL Cholesterol: 83 mg/dL (ref 0–99)
NonHDL: 114.69
Total CHOL/HDL Ratio: 3
Triglycerides: 159 mg/dL — ABNORMAL HIGH (ref 0.0–149.0)
VLDL: 31.8 mg/dL (ref 0.0–40.0)

## 2021-05-16 LAB — TSH: TSH: 5.02 u[IU]/mL (ref 0.35–5.50)

## 2021-05-16 NOTE — Progress Notes (Addendum)
Betty Foster DOB: 04-17-1966 Encounter date: 05/16/2021  This is a 55 y.o. female who presents for complete physical   History of present illness/Additional concerns: Last visit was one year ago to establish care.   Has a couple of areas she wants looked at. One on back, one in armpit, but on side with breast cancer. Thinks lipoma. Noted in last 2 months - one under arm some tenderness, but has issues in that armpit anyway post cancer/treatment. Doesn't think they have grown. About same size as prior one on her back.    Follows with gyn regularly. Last mammogram was 06/2020 Colonoscopy repeat due 09/2028  Hypothyroid:   Follows with derm regularly; hx of melanoma.   She is seeing dentist regularly; but dentist passed away unexpectedly.   Past Medical History:  Diagnosis Date   Atypical nevus 06/12/2015   severe atypical mole--mid back tx 09/12/2015 excision   Atypical nevus 12/22/2016   mod-severe mid back tx widershave 01/21/17   Atypical nevus 01/21/2017   mid back    Breast cancer ([place]) 06/2014   Invasive Lobular Carcinoma dx at age 73. Planning lumpectomy on 08/03/2014. Chemotherapy and radiation to be determined.   History of chicken pox    Hyperlipemia    Melanoma in situ ([place]) 06/12/2015   left thigh--Tx by Dr. Manley Mason   Nodulo-ulcerative basal cell carcinoma ([place]) 06/12/2015   Right Chest   Personal history of radiation therapy    Thyroid disease    Past Surgical History:  Procedure Laterality Date   BREAST LUMPECTOMY Right 07/2014   BREAST LUMPECTOMY WITH NEEDLE LOCALIZATION AND AXILLARY SENTINEL LYMPH NODE BX Right 08/03/2014   Procedure: NEEDLE LOCALIZED RIGHT BREAST LUMPECTOMY AND RIGHT  SENTINEL LYMPH NODE BX;  Surgeon: Coralie Keens, MD;  Location: [facility];  Service: General;  Laterality: Right;   NO PAST SURGERIES     No Known Allergies Current Meds  Medication Sig   atorvastatin (LIPITOR) 20 MG tablet Take 1 tablet (20 mg total)  by mouth daily.   levothyroxine (SYNTHROID) 50 MCG tablet Take 1 tablet (50 mcg total) by mouth daily.   tamoxifen (NOLVADEX) 20 MG tablet TAKE 1 TABLET BY MOUTH EVERY DAY   Current Facility-Administered Medications for the 05/16/21 encounter (Office Visit) with Caren Macadam, MD  Medication   0.9 %  sodium chloride infusion   Social History   Tobacco Use   Smoking status: Never   Smokeless tobacco: Never  Substance Use Topics   Alcohol use: Yes    Comment: socially   Family History  Problem Relation Age of Onset   Cancer Father 85       skin cancer, ? if melanoma   Colon polyps Father    Arthritis Father        ?rheumatoid   Diabetes Father    Hyperlipidemia Father    Hypertension Father    Hypothyroidism Mother    High blood pressure Mother    High Cholesterol Mother    Cancer Maternal Aunt 57       colorectal cancer   Colon cancer Maternal Aunt    Cancer Paternal Aunt 43       breast cancer   Cancer Paternal Uncle 73       prostate cancer   Cancer Maternal Grandmother 55       cervical or uterine cancer   Cancer Maternal Grandfather 72       lung cancer (smoker)   Depression Daughter  Hyperlipidemia Daughter    Hyperlipidemia Paternal Grandmother    CVA Paternal Grandmother 60   Heart attack Paternal Grandfather 91   Hyperlipidemia Paternal Grandfather    Hearing loss Paternal Grandfather    Healthy Daughter    Esophageal cancer Neg Hx    Rectal cancer Neg Hx    Stomach cancer Neg Hx      Review of Systems  Constitutional:  Negative for activity change, appetite change, chills, fatigue, fever and unexpected weight change.  HENT:  Negative for congestion, ear pain, hearing loss, sinus pressure, sinus pain, sore throat and trouble swallowing.   Eyes:  Negative for pain and visual disturbance.  Respiratory:  Negative for cough, chest tightness, shortness of breath and wheezing.   Cardiovascular:  Negative for chest pain, palpitations and leg  swelling.  Gastrointestinal:  Negative for abdominal pain, blood in stool, constipation, diarrhea, nausea and vomiting.  Genitourinary:  Negative for difficulty urinating and menstrual problem.  Musculoskeletal:  Negative for arthralgias and back pain.  Skin:  Negative for rash.       See hpi   Neurological:  Negative for dizziness, weakness, numbness and headaches.  Hematological:  Negative for adenopathy. Does not bruise/bleed easily.  Psychiatric/Behavioral:  Negative for sleep disturbance and suicidal ideas. The patient is not nervous/anxious.    CBC:  Lab Results  Component Value Date   WBC 6.9 05/15/2020   HGB 14.1 05/15/2020   HGB 13.6 12/31/2014   HCT 42.1 05/15/2020   HCT 39.5 12/31/2014   MCH 31.4 05/15/2020   MCHC 33.5 05/15/2020   RDW 12.8 05/15/2020   RDW 13.3 12/31/2014   PLT 210 05/15/2020   PLT 164 12/31/2014   MPV 11.9 05/15/2020   CMP: Lab Results  Component Value Date   NA 142 05/15/2020   NA 143 12/31/2014   K 5.0 05/15/2020   K 4.1 12/31/2014   CL 103 05/15/2020   CO2 30 05/15/2020   CO2 22 12/31/2014   ANIONGAP 12 (H) 12/31/2014   GLUCOSE 92 05/15/2020   GLUCOSE 103 12/31/2014   BUN 17 05/15/2020   BUN 11.1 12/31/2014   CREATININE 0.87 05/15/2020   CREATININE 0.8 12/31/2014   CALCIUM 10.2 05/15/2020   CALCIUM 9.4 12/31/2014   PROT 7.4 05/15/2020   PROT 7.4 12/31/2014   BILITOT 0.6 05/15/2020   BILITOT 0.69 12/31/2014   ALKPHOS 46 12/31/2014   ALT 17 05/15/2020   ALT 10 12/31/2014   AST 18 05/15/2020   AST 11 12/31/2014   LIPID: Lab Results  Component Value Date   CHOL 183 05/15/2020   TRIG 209 (H) 05/15/2020   HDL 63 05/15/2020   LDLCALC 90 05/15/2020    Objective:  BP 120/84 Comment: Simultaneous filing. User may not have seen previous data.  Pulse 66   Temp 97.9 F (36.6 C) (Oral)   Ht 5' 1.5" (1.562 m)   Wt 133 lb 12.8 oz (60.7 kg)   LMP 05/03/2019 (Exact Date)   SpO2 96%   BMI 24.87 kg/m   Weight: 133 lb 12.8 oz  (60.7 kg)   BP Readings from Last 3 Encounters:  05/16/21 120/84  01/30/21 (!) 155/96  05/15/20 130/90   Wt Readings from Last 3 Encounters:  05/16/21 133 lb 12.8 oz (60.7 kg)  01/30/21 138 lb 3.2 oz (62.7 kg)  05/15/20 131 lb 14.4 oz (59.8 kg)    Physical Exam Constitutional:      General: She is not in acute distress.    Appearance:  She is well-developed.  HENT:     Head: Normocephalic and atraumatic.     Right Ear: External ear normal.     Left Ear: External ear normal.     Mouth/Throat:     Pharynx: No oropharyngeal exudate.  Eyes:     Conjunctiva/sclera: Conjunctivae normal.     Pupils: Pupils are equal, round, and reactive to light.  Neck:     Thyroid: No thyromegaly.  Cardiovascular:     Rate and Rhythm: Normal rate and regular rhythm.     Heart sounds: Normal heart sounds. No murmur heard.   No friction rub. No gallop.  Pulmonary:     Effort: Pulmonary effort is normal.     Breath sounds: Normal breath sounds.  Abdominal:     General: Bowel sounds are normal. There is no distension.     Palpations: Abdomen is soft. There is no mass.     Tenderness: There is no abdominal tenderness. There is no guarding.     Hernia: No hernia is present.  Musculoskeletal:        General: No tenderness or deformity. Normal range of motion.     Cervical back: Normal range of motion and neck supple.  Lymphadenopathy:     Cervical: No cervical adenopathy.  Skin:    General: Skin is warm and dry.     Findings: No rash.     Comments: Lipoma left shoulder approx 4x4cm; 3x3.5 right shoulder, right axilla 2cm completely feels localized in soft tissue.   Neurological:     Mental Status: She is alert and oriented to person, place, and time.     Deep Tendon Reflexes: Reflexes normal.     Reflex Scores:      Tricep reflexes are 2+ on the right side and 2+ on the left side.      Bicep reflexes are 2+ on the right side and 2+ on the left side.      Brachioradialis reflexes are 2+ on the  right side and 2+ on the left side.      Patellar reflexes are 2+ on the right side and 2+ on the left side. Psychiatric:        Speech: Speech normal.        Behavior: Behavior normal.        Thought Content: Thought content normal.    Assessment/Plan: Health Maintenance Due  Topic Date Due   INFLUENZA VACCINE  04/28/2021   Health Maintenance reviewed.   1. Preventative health care Consider prevnar at future visit. Declines today.  - SARS CoV2 Serology(COVID19) AB(IgG,IgM),Immunoassay; Future  2. Axillary swelling Does feel to just be lipoma and no other lymph abnormality.  - [place] AXILLA RIGHT; Future  3. Hypothyroidism due to Hashimoto's thyroiditis Continue with synthroid.  - CBC with Differential/Platelet; Future - TSH; Future  4. Malignant neoplasm of upper-outer quadrant of right breast in female, estrogen receptor positive ([place]) Follows with oncology.   5. Lipid screening Lipitor '20mg'$ .   6. Screening for diabetes mellitus - Comprehensive metabolic panel; Future - Lipid panel; Future  7. Hyperlipidemia, unspecified hyperlipidemia type See above  8. Lipoma, unspecified site [facility] for surgery referral if desired.  Return in about 1 year (around 05/16/2022) for physical exam.  Micheline Rough, MD

## 2021-05-16 NOTE — Addendum Note (Signed)
Addended by: Amanda Cockayne on: 05/16/2021 08:50 AM   Modules accepted: Orders

## 2021-05-22 ENCOUNTER — Other Ambulatory Visit: Payer: Self-pay | Admitting: Family Medicine

## 2021-05-22 DIAGNOSIS — M7989 Other specified soft tissue disorders: Secondary | ICD-10-CM

## 2021-05-30 ENCOUNTER — Other Ambulatory Visit: Payer: Self-pay | Admitting: Family Medicine

## 2021-07-17 ENCOUNTER — Encounter: Payer: Self-pay | Admitting: Family Medicine

## 2021-07-18 ENCOUNTER — Encounter: Payer: Self-pay | Admitting: Family Medicine

## 2021-07-18 ENCOUNTER — Ambulatory Visit
Admission: RE | Admit: 2021-07-18 | Discharge: 2021-07-18 | Disposition: A | Discharge status: Home / Self Care | Payer: PRIVATE HEALTH INSURANCE | Source: Ambulatory Visit | Attending: Family Medicine | Admitting: Family Medicine

## 2021-07-18 ENCOUNTER — Other Ambulatory Visit: Payer: Self-pay

## 2021-07-18 ENCOUNTER — Ambulatory Visit (INDEPENDENT_AMBULATORY_CARE_PROVIDER_SITE_OTHER): Payer: PRIVATE HEALTH INSURANCE | Admitting: Family Medicine

## 2021-07-18 VITALS — BP 182/102 | HR 73 | Temp 98.5°F | Wt 139.6 lb

## 2021-07-18 DIAGNOSIS — M7989 Other specified soft tissue disorders: Secondary | ICD-10-CM

## 2021-07-18 DIAGNOSIS — Z0000 Encounter for general adult medical examination without abnormal findings: Secondary | ICD-10-CM

## 2021-07-18 DIAGNOSIS — I160 Hypertensive urgency: Secondary | ICD-10-CM | POA: Diagnosis not present

## 2021-07-18 LAB — POCT URINALYSIS DIPSTICK
Bilirubin, UA: NEGATIVE
Blood, UA: NEGATIVE
Glucose, UA: NEGATIVE
Ketones, UA: NEGATIVE
Leukocytes, UA: NEGATIVE
Nitrite, UA: NEGATIVE
Protein, UA: POSITIVE — AB
Spec Grav, UA: 1.02 (ref 1.010–1.025)
Urobilinogen, UA: NEGATIVE E.U./dL — AB
pH, UA: 7 (ref 5.0–8.0)

## 2021-07-18 MED ORDER — BENAZEPRIL HCL 10 MG PO TABS: 10.0000 mg | ORAL_TABLET | Freq: Every day | ORAL | 3 refills | Status: DC

## 2021-07-18 NOTE — Progress Notes (Signed)
Subjective:    Patient ID: Betty Foster, female    DOB: [birth year], 55 y.o.   MRN: [id number]  Chief Complaint  Patient presents with   Hypertension    Been elevated going on 2 weeks,  message sent last night on MyCHart with readings    HPI Patient is a 55 yo female with pmh sig for R breast invasive lobular carcinoma on tamoxifen (7/10 yrs), HLD, hypothyroidism due to Hashimoto's thryroiditis followed by Dr. Ethlyn Gallery who was seen today for acute concern.  Pt with elevated bp readings over the last few wks.  Sent in a log of bps via mychart, 143/98, 150/90, 170/100, 149/91, 104/88, 172/90, 163/103.  Pt denies HAs, CP, changes in vision.  Notes h/o white coat HTN.  States had a mammogram this am which was good, but could have made her anxious.  Pt eating at home and out.  Drinking 1 bottle of water per day.  Past Medical History:  Diagnosis Date   Atypical nevus 06/12/2015   severe atypical mole--mid back tx 09/12/2015 excision   Atypical nevus 12/22/2016   mod-severe mid back tx widershave 01/21/17   Atypical nevus 01/21/2017   mid back    Breast cancer ([place]) 06/2014   Invasive Lobular Carcinoma dx at age 50. Planning lumpectomy on 08/03/2014. Chemotherapy and radiation to be determined.   History of chicken pox    Hyperlipemia    Melanoma in situ ([place]) 06/12/2015   left thigh--Tx by Dr. Manley Mason   Nodulo-ulcerative basal cell carcinoma ([place]) 06/12/2015   Right Chest   Personal history of radiation therapy    Thyroid disease     No Known Allergies  ROS General: Denies fever, chills, night sweats, changes in weight, changes in appetite HEENT: Denies headaches, ear pain, changes in vision, rhinorrhea, sore throat CV: Denies CP, palpitations, SOB, orthopnea Pulm: Denies SOB, cough, wheezing GI: Denies abdominal pain, nausea, vomiting, diarrhea, constipation GU: Denies dysuria, hematuria, frequency, vaginal discharge Msk: Denies muscle cramps, joint pains Neuro: Denies  weakness, numbness, tingling Skin: Denies rashes, bruising Psych: Denies depression, anxiety, hallucinations     Objective:    Blood pressure (!) 182/102, pulse 73, temperature 98.5 F (36.9 C), temperature source Oral, weight 139 lb 9.6 oz (63.3 kg), last menstrual period 05/03/2019, SpO2 99 %.  Repeat bp 170/110  Gen. Pleasant, well-nourished, in no distress, normal affect   HEENT: [place]/AT, face symmetric, conjunctiva clear, no scleral icterus, PERRLA, EOMI, nares patent without drainage Neck: No JVD, no thyromegaly, no carotid bruits Lungs: no accessory muscle use, CTAB, no wheezes or rales Cardiovascular: RRR, no m/r/g, no peripheral edema Neuro:  A&Ox3, CN II-XII intact, normal gait Skin:  Warm, no lesions/ rash   Wt Readings from Last 3 Encounters:  05/16/21 133 lb 12.8 oz (60.7 kg)  01/30/21 138 lb 3.2 oz (62.7 kg)  05/15/20 131 lb 14.4 oz (59.8 kg)    Lab Results  Component Value Date   WBC 5.0 05/16/2021   HGB 12.6 05/16/2021   HCT 37.2 05/16/2021   PLT 186.0 05/16/2021   GLUCOSE 102 (H) 05/16/2021   CHOL 168 05/16/2021   TRIG 159.0 (H) 05/16/2021   HDL 53.10 05/16/2021   LDLCALC 83 05/16/2021   ALT 15 05/16/2021   AST 15 05/16/2021   NA 142 05/16/2021   K 4.1 05/16/2021   CL 105 05/16/2021   CREATININE 0.90 05/16/2021   BUN 20 05/16/2021   CO2 28 05/16/2021   TSH 5.02 05/16/2021    Assessment/Plan:  Hypertensive urgency  -bp 182/102 -recheck 170/110 -start benazepril 10 mg daily -lifestyle modifications -given handouts -check bp at home -given strict precautions - Plan: CMP, TSH, POCT urinalysis dipstick, benazepril (LOTENSIN) 10 MG tablet  Order Release from previous visit: Preventative health care  - Plan: SARS CoV2 Serology(COVID19) AB(IgG,IgM),Immunoassay  F/u in 3 wks, sooner if needed.  Grier Mitts, MD

## 2021-07-18 NOTE — Addendum Note (Signed)
Addended by: Anderson Malta on: 07/18/2021 02:40 PM   Modules accepted: Orders

## 2021-07-23 LAB — TSH: TSH: 6.29 mIU/L — ABNORMAL HIGH (ref 0.40–4.50)

## 2021-07-23 LAB — SARS COV-2 SEROLOGY(COVID-19)AB(IGG,IGM),IMMUNOASSAY
SARS CoV-2 AB IgG: POSITIVE — AB
SARS CoV-2 IgM: NEGATIVE

## 2021-07-23 LAB — TEST AUTHORIZATION

## 2021-07-23 LAB — COMPLETE METABOLIC PANEL WITH GFR

## 2021-07-28 ENCOUNTER — Other Ambulatory Visit: Payer: Self-pay | Admitting: Family Medicine

## 2021-07-28 MED ORDER — LEVOTHYROXINE SODIUM 75 MCG PO TABS: 75.0000 ug | ORAL_TABLET | Freq: Every day | ORAL | 1 refills | Status: DC

## 2021-08-08 ENCOUNTER — Encounter: Payer: Self-pay | Admitting: Family Medicine

## 2021-08-08 ENCOUNTER — Ambulatory Visit (INDEPENDENT_AMBULATORY_CARE_PROVIDER_SITE_OTHER): Payer: PRIVATE HEALTH INSURANCE | Admitting: Family Medicine

## 2021-08-08 VITALS — BP 120/84 | HR 69 | Temp 98.2°F | Ht 61.5 in | Wt 135.1 lb

## 2021-08-08 DIAGNOSIS — R7309 Other abnormal glucose: Secondary | ICD-10-CM | POA: Diagnosis not present

## 2021-08-08 DIAGNOSIS — E038 Other specified hypothyroidism: Secondary | ICD-10-CM

## 2021-08-08 DIAGNOSIS — I10 Essential (primary) hypertension: Secondary | ICD-10-CM

## 2021-08-08 DIAGNOSIS — E063 Autoimmune thyroiditis: Secondary | ICD-10-CM

## 2021-08-08 NOTE — Progress Notes (Signed)
Betty Foster DOB: Aug 15, 1966 Encounter date: 08/08/2021  This is a 55 y.o. female who presents with blood pressure follow up   History of present illness:  Had bp checked at health fair at work and it was high. That was what triggered her monitoring. Has been keeping up with checking bp at home; left numbers at home. A couple of times was over 161 diastolic, systolic usually 096-045. Using electronic arm cuff. Checks at random times in day.   Was increased to 52mcg daily synthroid due to elevated TSH.   Some headaches. No chest pressure or pain. Headache varies - in morning; some mid day. Have been better since starting medications. Last headache 2 weeks ago.    No Known Allergies Current Meds  Medication Sig   atorvastatin (LIPITOR) 20 MG tablet TAKE 1 TABLET BY MOUTH EVERY DAY   benazepril (LOTENSIN) 10 MG tablet Take 1 tablet (10 mg total) by mouth daily.   levothyroxine (SYNTHROID) 75 MCG tablet Take 1 tablet (75 mcg total) by mouth daily before breakfast.   tamoxifen (NOLVADEX) 20 MG tablet TAKE 1 TABLET BY MOUTH EVERY DAY   Current Facility-Administered Medications for the 08/08/21 encounter (Office Visit) with Caren Macadam, MD  Medication   0.9 %  sodium chloride infusion    Review of Systems  Constitutional:  Negative for chills, fatigue and fever.  Respiratory:  Negative for cough, chest tightness, shortness of breath and wheezing.   Cardiovascular:  Negative for chest pain, palpitations and leg swelling.  Neurological:  Positive for headaches.   Objective:  BP 120/84 (BP Location: Left Arm, Patient Position: Sitting, Cuff Size: Normal)   Pulse 69   Temp 98.2 F (36.8 C) (Oral)   Ht 5' 1.5" (1.562 m)   Wt 135 lb 1.6 oz (61.3 kg)   LMP 05/03/2019 (Exact Date)   SpO2 99%   BMI 25.11 kg/m   Weight: 135 lb 1.6 oz (61.3 kg)   BP Readings from Last 3 Encounters:  08/08/21 120/84  07/18/21 (!) 182/102  05/16/21 120/84   Wt Readings from Last 3  Encounters:  08/08/21 135 lb 1.6 oz (61.3 kg)  07/18/21 139 lb 9.6 oz (63.3 kg)  05/16/21 133 lb 12.8 oz (60.7 kg)    Physical Exam Constitutional:      General: She is not in acute distress.    Appearance: She is well-developed.  Cardiovascular:     Rate and Rhythm: Normal rate and regular rhythm.     Heart sounds: Normal heart sounds. No murmur heard.   No friction rub.  Pulmonary:     Effort: Pulmonary effort is normal. No respiratory distress.     Breath sounds: Normal breath sounds. No wheezing or rales.  Musculoskeletal:     Right lower leg: No edema.     Left lower leg: No edema.  Neurological:     Mental Status: She is alert and oriented to person, place, and time.  Psychiatric:        Behavior: Behavior normal.    Assessment/Plan 1. Hypothyroidism due to Hashimoto's thyroiditis Recent med adjustment from 50 to 75 mcg Synthroid.  Recheck in 6 weeks. - TSH; Future  2. Hypertension, unspecified type My recheck of bp was 120/82; checked x 2 with different cuffs.  Concerning however that she is getting more elevated blood pressure readings at home.  I have asked her to bring her home cuff and work and check against a work cuff to see if her cuff may  be overeating.  However, she was significantly elevated in the office previously.  Consider treatment adjustment pending report of blood pressure cuff for comparison and home readings.   EKG stable, sinus rhythm, slight bradycardia.  No acute changes. - Comprehensive metabolic panel; Future - EKG 12-Lead  3. Elevated glucose - Hemoglobin A1c; Future    Return in 6 weeks (on 09/19/2021) for pending home bp report; bloodwork in 6 weeks.     Micheline Rough, MD

## 2021-08-08 NOTE — Patient Instructions (Signed)
I would like for all your pressures to be less than 135/85. Compare home cuff to another at work. Let me know how this looks and tell me home numbers again.

## 2021-08-13 ENCOUNTER — Encounter: Payer: Self-pay | Admitting: Family Medicine

## 2021-08-29 ENCOUNTER — Telehealth: Payer: Self-pay | Admitting: Family Medicine

## 2021-08-29 ENCOUNTER — Other Ambulatory Visit: Payer: Self-pay | Admitting: Family Medicine

## 2021-08-29 MED ORDER — BENAZEPRIL HCL 20 MG PO TABS: 20.0000 mg | ORAL_TABLET | Freq: Every day | ORAL | 1 refills | Status: DC

## 2021-08-29 NOTE — Telephone Encounter (Signed)
Rx has been sent  

## 2021-08-29 NOTE — Telephone Encounter (Signed)
Pt said she was told to double up her benazepril (LOTENSIN) 10 MG tablet . Pt has been taking 2 of 10 MG equal 20 mg and now  pt needs new rx benazepril 20 mg once a day  [id number] [address], [place] Phone:  [phone]  Fax:  [phone]

## 2021-09-18 ENCOUNTER — Other Ambulatory Visit (INDEPENDENT_AMBULATORY_CARE_PROVIDER_SITE_OTHER): Payer: PRIVATE HEALTH INSURANCE

## 2021-09-18 DIAGNOSIS — R7309 Other abnormal glucose: Secondary | ICD-10-CM

## 2021-09-18 DIAGNOSIS — I10 Essential (primary) hypertension: Secondary | ICD-10-CM

## 2021-09-18 DIAGNOSIS — E063 Autoimmune thyroiditis: Secondary | ICD-10-CM

## 2021-09-18 DIAGNOSIS — E038 Other specified hypothyroidism: Secondary | ICD-10-CM | POA: Diagnosis not present

## 2021-09-18 LAB — COMPREHENSIVE METABOLIC PANEL
ALT: 18 U/L (ref 0–35)
AST: 16 U/L (ref 0–37)
Albumin: 4.2 g/dL (ref 3.5–5.2)
Alkaline Phosphatase: 51 U/L (ref 39–117)
BUN: 16 mg/dL (ref 6–23)
CO2: 31 mEq/L (ref 19–32)
Calcium: 9.8 mg/dL (ref 8.4–10.5)
Chloride: 104 mEq/L (ref 96–112)
Creatinine, Ser: 0.91 mg/dL (ref 0.40–1.20)
GFR: 71.98 mL/min (ref >60.00)
Glucose, Bld: 101 mg/dL — ABNORMAL HIGH (ref 70–99)
Potassium: 3.9 mEq/L (ref 3.5–5.1)
Sodium: 142 mEq/L (ref 135–145)
Total Bilirubin: 0.4 mg/dL (ref 0.2–1.2)
Total Protein: 7 g/dL (ref 6.0–8.3)

## 2021-09-18 LAB — HEMOGLOBIN A1C: Hgb A1c MFr Bld: 6 % (ref 4.6–6.5)

## 2021-09-18 LAB — TSH: TSH: 0.56 u[IU]/mL (ref 0.35–5.50)

## 2021-10-01 LAB — HM PAP SMEAR: HM Pap smear: NEGATIVE

## 2021-12-02 ENCOUNTER — Encounter: Payer: Self-pay | Admitting: Family Medicine

## 2022-01-02 ENCOUNTER — Other Ambulatory Visit: Payer: Self-pay | Admitting: Hematology and Oncology

## 2022-01-20 ENCOUNTER — Other Ambulatory Visit: Payer: Self-pay | Admitting: Family Medicine

## 2022-01-22 NOTE — Progress Notes (Signed)
? ?Patient Care Team: ?Caren Macadam, MD as PCP - General (Family Medicine) ?Nicholas Lose, MD as Consulting Physician (Hematology and Oncology) ?[facility] (Dermatology) ?Hoyt Koch, MD as Referring Physician (Obstetrics and Gynecology) ? ?DIAGNOSIS:  ?Encounter Diagnosis  ?Name Primary?  ? Malignant neoplasm of upper-outer quadrant of right breast in female, estrogen receptor positive ([place])   ? ? ?SUMMARY OF ONCOLOGIC HISTORY: ?Oncology History  ?Breast cancer of upper-outer quadrant of right female breast ([place])  ?07/02/2014 Initial Diagnosis  ? Infiltrating lobular carcinoma right breast grade 2 ? ?  ?07/17/2014 Breast MRI  ? Small spiculated mass 1.5 cm and right axillary tail, no lymph nodes, small hepatic cysts probably benign ? ?  ?08/03/2014 Surgery  ? Right breast lumpectomy: Invasive lobular cancer, grade 2, 1.5 cm, 2 SLN negative, ER/PR positive HER-2 negative, tumor size 1.5 cm, T1 C. N0 M0 stage IA ? ?  ?09/22/2014 - 11/05/2014 Radiation Therapy  ? XRT ? ?  ?11/30/2014 -  Anti-estrogen oral therapy  ?  tamoxifen 20 mg daily ?10 years ? ?  ? ? ?CHIEF COMPLIANT: Follow-up of right breast cancer on tamoxifen therapy ? ?INTERVAL HISTORY: Betty Foster is a 56 y.o. with above-mentioned history of right breast cancer treated with lumpectomy, radiation, and who is currently on tamoxifen therapy. She presents to the clinic today for a follow-up. She state she is tolerating the tamoxifen. She denies pain and discomfort.  ? ?ALLERGIES:  has No Known Allergies. ? ?MEDICATIONS:  ?Current Outpatient Medications  ?Medication Sig Dispense Refill  ? atorvastatin (LIPITOR) 20 MG tablet TAKE 1 TABLET BY MOUTH EVERY DAY 90 tablet 3  ? benazepril (LOTENSIN) 20 MG tablet Take 1 tablet (20 mg total) by mouth daily. 90 tablet 1  ? levothyroxine (SYNTHROID) 75 MCG tablet TAKE 1 TABLET BY MOUTH DAILY BEFORE BREAKFAST. 30 tablet 5  ? tamoxifen (NOLVADEX) 20 MG tablet Take 1 tablet (20 mg total) by  mouth daily. 90 tablet 3  ? ?Current Facility-Administered Medications  ?Medication Dose Route Frequency Provider Last Rate Last Admin  ? 0.9 %  sodium chloride infusion  500 mL Intravenous Once Mansouraty, Telford Nab., MD      ? ? ?PHYSICAL EXAMINATION: ?ECOG PERFORMANCE STATUS: 1 - Symptomatic but completely ambulatory ? ?Vitals:  ? 02/05/22 0824  ?BP: 120/66  ?Pulse: 67  ?Resp: 18  ?Temp: (!) 97.2 ?F (36.2 ?C)  ?SpO2: 100%  ? ?Filed Weights  ? 02/05/22 0824  ?Weight: 130 lb 12.8 oz (59.3 kg)  ? ? ?BREAST: No palpable masses or nodules in either right or left breasts. No palpable axillary supraclavicular or infraclavicular adenopathy no breast tenderness or nipple discharge. (exam performed in the presence of a chaperone) ? ?LABORATORY DATA:  ?I have reviewed the data as listed ? ?  Latest Ref Rng & Units 09/18/2021  ?  8:16 AM 07/18/2021  ? 12:04 PM 05/16/2021  ?  8:50 AM  ?CMP  ?Glucose 70 - 99 mg/dL 101   CANCELED   102    ?BUN 6 - 23 mg/dL 16    20    ?Creatinine 0.40 - 1.20 mg/dL 0.91    0.90    ?Sodium 135 - 145 mEq/L 142    142    ?Potassium 3.5 - 5.1 mEq/L 3.9    4.1    ?Chloride 96 - 112 mEq/L 104    105    ?CO2 19 - 32 mEq/L 31    28    ?Calcium 8.4 -  10.5 mg/dL 9.8    9.6    ?Total Protein 6.0 - 8.3 g/dL 7.0    7.1    ?Total Bilirubin 0.2 - 1.2 mg/dL 0.4    0.6    ?Alkaline Phos 39 - 117 U/L 51    68    ?AST 0 - 37 U/L 16    15    ?ALT 0 - 35 U/L 18    15    ? ? ?Lab Results  ?Component Value Date  ? WBC 5.0 05/16/2021  ? HGB 12.6 05/16/2021  ? HCT 37.2 05/16/2021  ? MCV 91.9 05/16/2021  ? PLT 186.0 05/16/2021  ? NEUTROABS 2.8 05/16/2021  ? ? ?ASSESSMENT & PLAN:  ?Breast cancer of upper-outer quadrant of right female breast ?Right breast invasive lobular cancer grade 2, 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 N0 M0 stage IA; Oncotype DX recurrence score 20, intermediate risk ROR 13% , tamoxifen started  December 2015 ?  ?Tamoxifen toxicities: Monitoring closely for toxicities . ?1.   Occasional hot flashes ?Otherwise tolerating it well. ?Plan to treat her with tamoxifen for 10 years ?  ?Breast cancer surveillance: ?1. Annual mammograms and ultrasound 07/18/2021: No abnormalities at the site of palpable concern; Breast density category B ?2. Breast exams 02/05/2022: No evidence of disease recurrence ?  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  ?Return to clinic in 1 year for follow-up ? ? ? ?No orders of the defined types were placed in this encounter. ? ?The patient has a good understanding of the overall plan. she agrees with it. she will call with any problems that may develop before the next visit here. ?Total time spent: 30 mins including face to face time and time spent for planning, charting and co-ordination of care ? ? Harriette Ohara, MD ?02/05/22 ? ? ? I Gardiner Coins am scribing for Dr. Lindi Adie ? ?I have reviewed the above documentation for accuracy and completeness, and I agree with the above. ?  ?

## 2022-02-05 ENCOUNTER — Inpatient Hospital Stay: Payer: PRIVATE HEALTH INSURANCE | Attending: Hematology and Oncology | Admitting: Hematology and Oncology

## 2022-02-05 ENCOUNTER — Other Ambulatory Visit: Payer: Self-pay

## 2022-02-05 DIAGNOSIS — C50411 Malignant neoplasm of upper-outer quadrant of right female breast: Secondary | ICD-10-CM | POA: Insufficient documentation

## 2022-02-05 DIAGNOSIS — Z85820 Personal history of malignant melanoma of skin: Secondary | ICD-10-CM | POA: Insufficient documentation

## 2022-02-05 DIAGNOSIS — Z923 Personal history of irradiation: Secondary | ICD-10-CM | POA: Diagnosis not present

## 2022-02-05 DIAGNOSIS — Z170 Estrogen receptor positive status [ER+]: Secondary | ICD-10-CM | POA: Diagnosis not present

## 2022-02-05 DIAGNOSIS — Z79810 Long term (current) use of selective estrogen receptor modulators (SERMs): Secondary | ICD-10-CM | POA: Insufficient documentation

## 2022-02-05 MED ORDER — TAMOXIFEN CITRATE 20 MG PO TABS: 20.0000 mg | ORAL_TABLET | Freq: Every day | ORAL | 3 refills | Status: DC

## 2022-02-05 NOTE — Assessment & Plan Note (Signed)
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 N0 M0 stage IA; Oncotype DX recurrence score 20, intermediate risk ROR 13% , tamoxifen started? December 2015 ?? ?Tamoxifen toxicities: Monitoring closely for toxicities . ?1.? Occasional hot flashes ?Otherwise tolerating it well. ?Plan to treat her with tamoxifen for 10 years ?? ?Breast cancer surveillance: ?1. Annual mammograms?and ultrasound 07/18/2021: No abnormalities at the site of palpable concern; Breast density category B ?2. Breast exams?02/05/2022: No evidence of disease recurrence ?? ?Skin melanoma on thigh: Resected at [facility] ?? ?Return to clinic in 1 year for follow-up ?

## 2022-02-18 ENCOUNTER — Other Ambulatory Visit: Payer: Self-pay | Admitting: Family Medicine

## 2022-05-18 ENCOUNTER — Other Ambulatory Visit: Payer: Self-pay | Admitting: *Deleted

## 2022-05-19 MED ORDER — LEVOTHYROXINE SODIUM 75 MCG PO TABS: 75.0000 ug | ORAL_TABLET | Freq: Every day | ORAL | 0 refills | Status: DC

## 2022-05-20 ENCOUNTER — Encounter: Payer: Self-pay | Admitting: Family Medicine

## 2022-05-20 ENCOUNTER — Ambulatory Visit (INDEPENDENT_AMBULATORY_CARE_PROVIDER_SITE_OTHER): Payer: PRIVATE HEALTH INSURANCE | Admitting: Family Medicine

## 2022-05-20 ENCOUNTER — Encounter: Payer: PRIVATE HEALTH INSURANCE | Admitting: Family Medicine

## 2022-05-20 VITALS — BP 100/68 | HR 67 | Temp 97.7°F | Ht 61.5 in | Wt 133.4 lb

## 2022-05-20 DIAGNOSIS — I10 Essential (primary) hypertension: Secondary | ICD-10-CM | POA: Diagnosis not present

## 2022-05-20 DIAGNOSIS — R7303 Prediabetes: Secondary | ICD-10-CM | POA: Diagnosis not present

## 2022-05-20 DIAGNOSIS — E038 Other specified hypothyroidism: Secondary | ICD-10-CM | POA: Diagnosis not present

## 2022-05-20 DIAGNOSIS — E063 Autoimmune thyroiditis: Secondary | ICD-10-CM | POA: Diagnosis not present

## 2022-05-20 LAB — POCT GLYCOSYLATED HEMOGLOBIN (HGB A1C): Hemoglobin A1C: 5.5 % (ref 4.0–5.6)

## 2022-05-20 IMAGING — MG DIGITAL DIAGNOSTIC BILAT W/ TOMO W/ CAD
8 series · 8 of 24 positions shown · non-contrast
Comparison: Previous exam(s).

CLINICAL DATA: Palpable lump within the RIGHT axilla. History of
RIGHT breast cancer status post lumpectomy and radiation therapy.
History of benign lipomas.

EXAM:
DIGITAL DIAGNOSTIC BILATERAL MAMMOGRAM WITH TOMOSYNTHESIS AND CAD;
US AXILLARY RIGHT
TECHNIQUE: Bilateral digital diagnostic mammography and breast tomosynthesis
was performed. The images were evaluated with computer-aided
detection.; Targeted ultrasound examination of the right axilla was
performed.

[L MLO synth-2D]
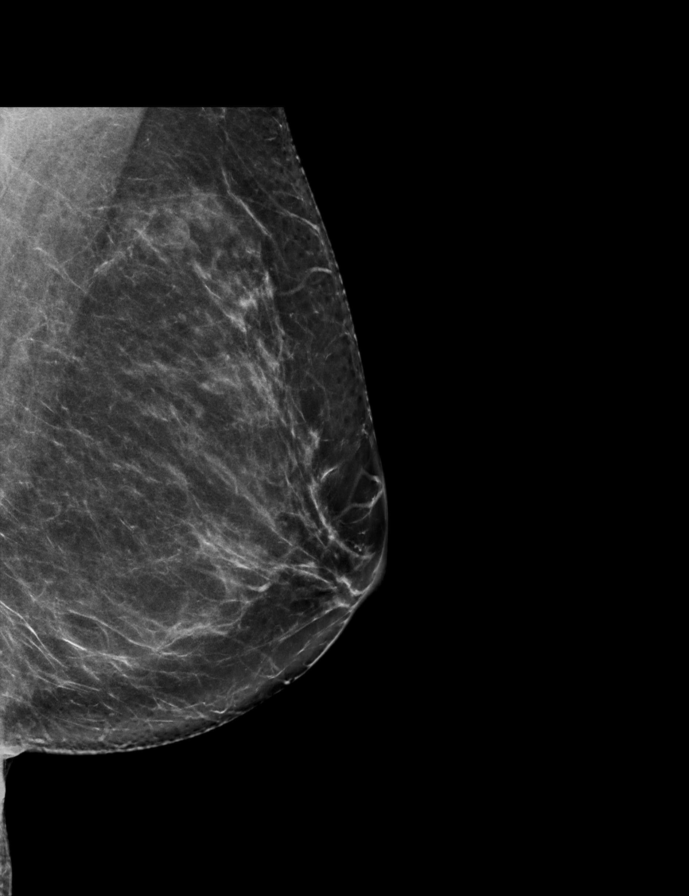

[R CC synth-2D]
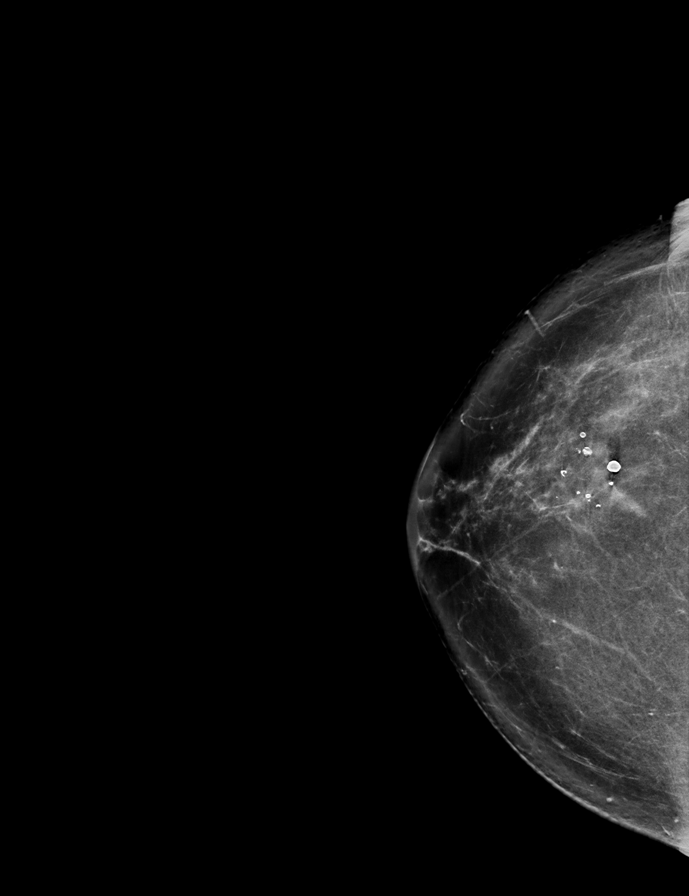

[L CC synth-2D]
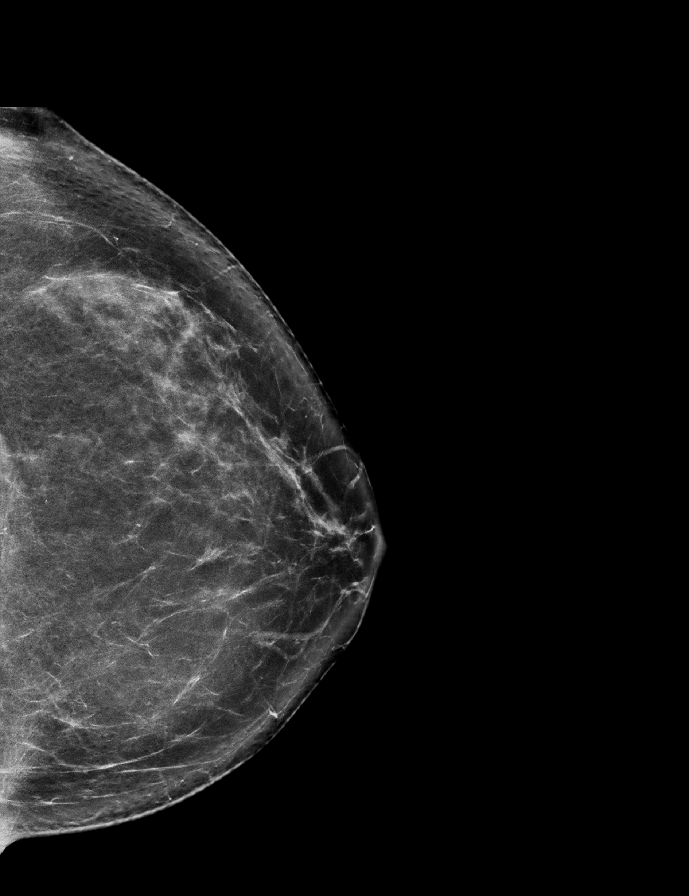

[R MLO synth-2D]
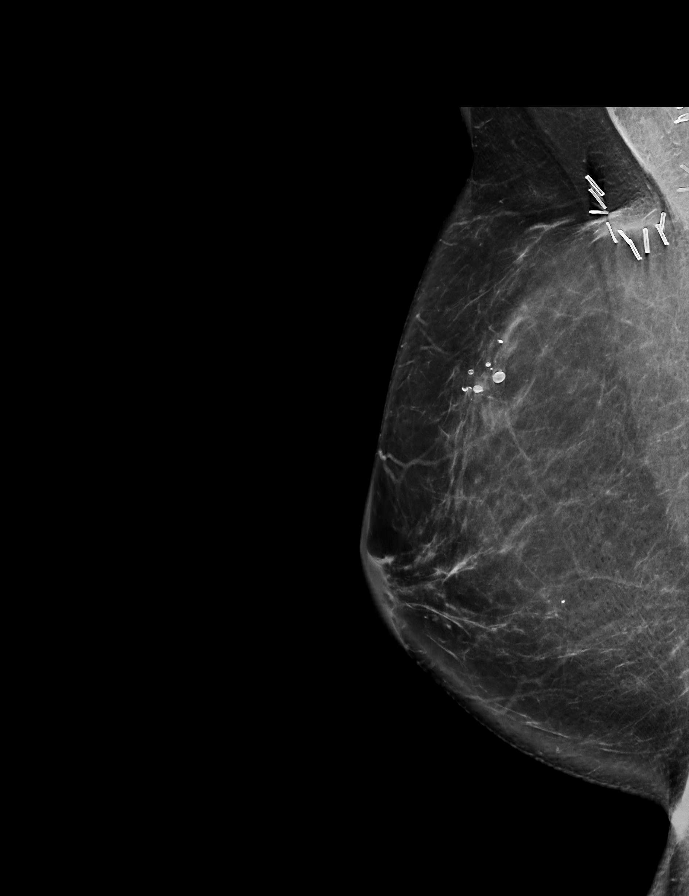

[L CC tomo · tomo slice 39/78.0]
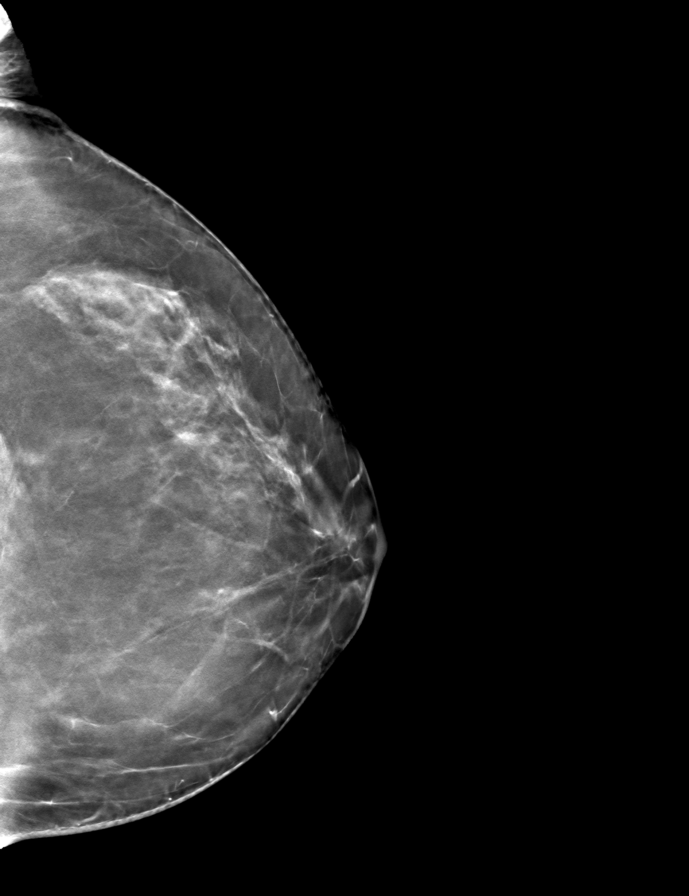

[L MLO tomo · tomo slice 37/74.0]
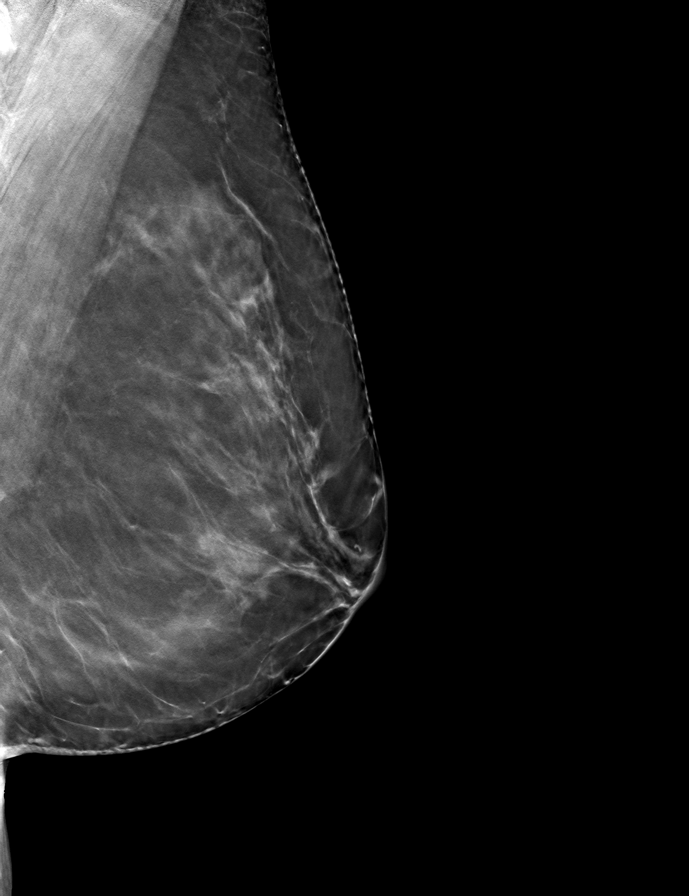

[R MLO tomo · tomo slice 41/80.0]
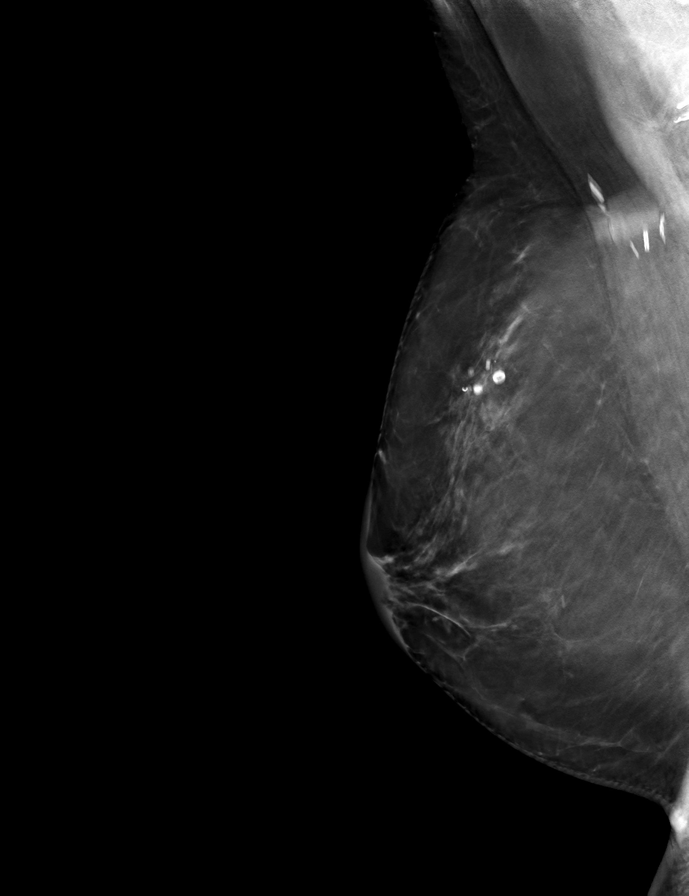

[R CC tomo · tomo slice 41/81.0]
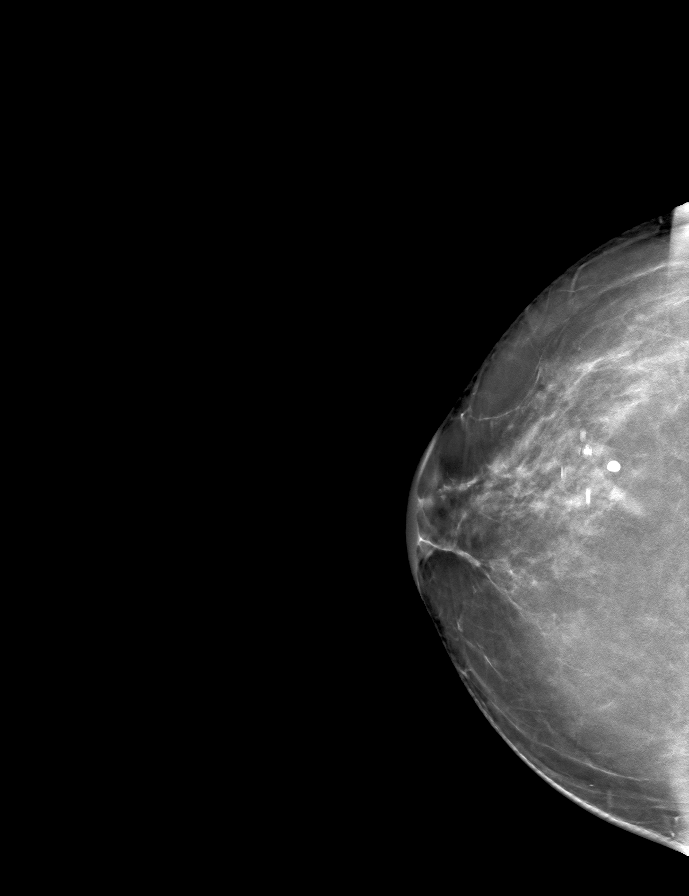

[8 of 24 positions shown; findings below may reference images not displayed]

ACR Breast Density Category b: There are scattered areas of
fibroglandular density.
FINDINGS: There are stable postsurgical changes within the RIGHT breast. There
are no new dominant masses, suspicious calcifications or secondary
signs of malignancy within either breast.

On physical exam, there is a palpable thickening within the upper
RIGHT axilla/upper arm without evidence of circumscribed mass.

Targeted ultrasound is performed, evaluating the upper RIGHT axilla
and adjacent upper arm soft tissues as directed by the patient,
showing only normal soft tissues throughout. Normal-appearing
muscles underlie the superficial soft tissues. No solid or cystic
mass. No discrete lipoma. No enlarged lymph nodes. Postsurgical
scarring within the more inferior RIGHT axilla is away from the area
of clinical concern.
IMPRESSION: 1. No evidence of malignancy within either breast.
2. No evidence of malignancy or acute findings within the upper
RIGHT axilla, corresponding to the palpable area of concern. No
discrete lipoma identified.

RECOMMENDATION:
1.  Screening mammogram in one year.(Code:18-C-DSQ)
2. Clinical follow-up for the palpable area of concern. If the
palpable findings were deemed to increase in size or firmness, would
recommend repeat ultrasound at that time and would also consider
soft tissue MRI at that time if ultrasound was again negative. These
recommendations were discussed with the patient today.

I have discussed the findings and recommendations with the patient.
If applicable, a reminder letter will be sent to the patient
regarding the next appointment.

BI-RADS CATEGORY  2: Benign.

## 2022-05-20 NOTE — Progress Notes (Signed)
Established Patient Office Visit  Subjective   Patient ID: Betty Foster, female    DOB: [birth year]  Age: 56 y.o. MRN: [id number]  Chief Complaint  Patient presents with   Establish Care    Patient is here for transition of care visit. Patient states she is doing well today, no new symptoms or complaints to report.   HTN-- on benazepril 20 mg daily, states she is doing well on this medication, denies any SOB, dizziness or chest pain. Reports compliance with her medication, no side effects. Checks her BP occasionally at home  Hypothyroidism-- I reviewed her last TSH, pt reports no weight gain, constipation or cold intolerance. Currently on 75 mcg daily.   Current Outpatient Medications  Medication Instructions   atorvastatin (LIPITOR) 20 MG tablet TAKE 1 TABLET BY MOUTH EVERY DAY   benazepril (LOTENSIN) 20 MG tablet TAKE 1 TABLET BY MOUTH EVERY DAY   levothyroxine (SYNTHROID) 75 mcg, Oral, Daily before breakfast   tamoxifen (NOLVADEX) 20 mg, Oral, Daily    Patient Active Problem List   Diagnosis Date Noted   Prediabetes 05/20/2022   HTN (hypertension) 05/20/2022   Hypothyroidism due to Hashimoto's thyroiditis 05/17/2019   Hashimoto's thyroiditis 05/17/2019   Breast cancer of upper-outer quadrant of right female breast ([place]) 07/19/2014   Family history of breast cancer 07/19/2014   Family history of colon cancer 07/19/2014      Review of Systems  All other systems reviewed and are negative.     Objective:     BP 100/68 (BP Location: Left Arm, Patient Position: Sitting, Cuff Size: Normal)   Pulse 67   Temp 97.7 F (36.5 C) (Oral)   Ht 5' 1.5" (1.562 m)   Wt 133 lb 6.4 oz (60.5 kg)   LMP 05/03/2019 (Exact Date)   SpO2 98%   BMI 24.80 kg/m  BP Readings from Last 3 Encounters:  05/20/22 100/68  02/05/22 120/66  08/08/21 120/84      Physical Exam Vitals reviewed.  Constitutional:      Appearance: Normal appearance. She is well-groomed and normal weight.   Eyes:     Extraocular Movements: Extraocular movements intact.     Conjunctiva/sclera: Conjunctivae normal.     Pupils: Pupils are equal, round, and reactive to light.  Neck:     Thyroid: No thyroid mass or thyromegaly.  Cardiovascular:     Rate and Rhythm: Normal rate and regular rhythm.     Pulses: Normal pulses.     Heart sounds: S1 normal and S2 normal.  Pulmonary:     Effort: Pulmonary effort is normal.     Breath sounds: Normal breath sounds and air entry.  Musculoskeletal:     Cervical back: Normal range of motion and neck supple.     Right lower leg: No edema.     Left lower leg: No edema.  Neurological:     Mental Status: She is alert and oriented to person, place, and time. Mental status is at baseline.     Gait: Gait is intact.  Psychiatric:        Mood and Affect: Mood and affect normal.        Speech: Speech normal.        Behavior: Behavior normal.        Judgment: Judgment normal.      Results for orders placed or performed in visit on 05/20/22  POC HgB A1c  Result Value Ref Range   Hemoglobin A1C 5.5 4.0 - 5.6 %  HbA1c POC (<> result, manual entry)     HbA1c, POC (prediabetic range)     HbA1c, POC (controlled diabetic range)      Last hemoglobin A1c Lab Results  Component Value Date   HGBA1C 5.5 05/20/2022      The 10-year ASCVD risk score (Arnett DK, et al., 2019) is: 1.3%    Assessment & Plan:   Problem List Items Addressed This Visit       Cardiovascular and Mediastinum   HTN (hypertension) (Chronic)    BP well controlled on benazepril 20 mg daily, will continue this medication, will check her labs at her next visit.      Relevant Orders   CMP     Endocrine   Hypothyroidism due to Hashimoto's thyroiditis - Primary    No sx of hypothyroidism at this visit, will continue 75 mcg dose of levothyroxine. TSH at next visit in 6 months.      Relevant Orders   TSH     Other   Prediabetes    A1C was 6.0 at the last visit, rechecked  today and is 5.5, pt is limiting sugar in her diet, I encouraged her to continue reducing sugar and starches and to increase exercise.      Relevant Orders   POC HgB A1c (Completed)   Lipid Panel    Return in about 6 months (around 11/20/2022) for follow up HTN and thyroid.    Farrel Conners, MD

## 2022-05-20 NOTE — Assessment & Plan Note (Signed)
A1C was 6.0 at the last visit, rechecked today and is 5.5, pt is limiting sugar in her diet, I encouraged her to continue reducing sugar and starches and to increase exercise.

## 2022-05-20 NOTE — Assessment & Plan Note (Signed)
BP well controlled on benazepril 20 mg daily, will continue this medication, will check her labs at her next visit.

## 2022-05-20 NOTE — Assessment & Plan Note (Signed)
No sx of hypothyroidism at this visit, will continue 75 mcg dose of levothyroxine. TSH at next visit in 6 months.

## 2022-05-25 ENCOUNTER — Other Ambulatory Visit: Payer: Self-pay | Admitting: *Deleted

## 2022-05-25 MED ORDER — ATORVASTATIN CALCIUM 20 MG PO TABS: 20.0000 mg | ORAL_TABLET | Freq: Every day | ORAL | 1 refills | Status: DC

## 2022-06-11 ENCOUNTER — Encounter: Payer: Self-pay | Admitting: Family Medicine

## 2022-06-29 ENCOUNTER — Other Ambulatory Visit: Payer: Self-pay | Admitting: Pediatric Radiology

## 2022-06-29 DIAGNOSIS — Z1231 Encounter for screening mammogram for malignant neoplasm of breast: Secondary | ICD-10-CM

## 2022-07-06 ENCOUNTER — Other Ambulatory Visit: Payer: Self-pay | Admitting: *Deleted

## 2022-07-06 MED ORDER — BENAZEPRIL HCL 20 MG PO TABS: 20.0000 mg | ORAL_TABLET | Freq: Every day | ORAL | 1 refills | Status: DC

## 2022-07-23 ENCOUNTER — Ambulatory Visit
Admission: RE | Admit: 2022-07-23 | Discharge: 2022-07-23 | Disposition: A | Discharge status: Home / Self Care | Payer: PRIVATE HEALTH INSURANCE | Source: Ambulatory Visit | Attending: Pediatric Radiology | Admitting: Pediatric Radiology

## 2022-07-23 DIAGNOSIS — Z1231 Encounter for screening mammogram for malignant neoplasm of breast: Secondary | ICD-10-CM

## 2022-08-17 ENCOUNTER — Other Ambulatory Visit: Payer: Self-pay | Admitting: Family Medicine

## 2022-08-29 ENCOUNTER — Encounter: Payer: Self-pay | Admitting: Family Medicine

## 2022-10-17 ENCOUNTER — Other Ambulatory Visit: Payer: Self-pay | Admitting: Family Medicine

## 2022-11-03 ENCOUNTER — Other Ambulatory Visit (INDEPENDENT_AMBULATORY_CARE_PROVIDER_SITE_OTHER): Payer: PRIVATE HEALTH INSURANCE

## 2022-11-03 DIAGNOSIS — E063 Autoimmune thyroiditis: Secondary | ICD-10-CM | POA: Diagnosis not present

## 2022-11-03 DIAGNOSIS — I10 Essential (primary) hypertension: Secondary | ICD-10-CM

## 2022-11-03 DIAGNOSIS — R7303 Prediabetes: Secondary | ICD-10-CM | POA: Diagnosis not present

## 2022-11-03 DIAGNOSIS — E038 Other specified hypothyroidism: Secondary | ICD-10-CM | POA: Diagnosis not present

## 2022-11-03 LAB — LIPID PANEL
Cholesterol: 158 mg/dL (ref 0–200)
HDL: 52.1 mg/dL (ref >39.00)
LDL Cholesterol: 78 mg/dL (ref 0–99)
NonHDL: 105.92
Total CHOL/HDL Ratio: 3
Triglycerides: 141 mg/dL (ref 0.0–149.0)
VLDL: 28.2 mg/dL (ref 0.0–40.0)

## 2022-11-03 LAB — COMPREHENSIVE METABOLIC PANEL
ALT: 16 U/L (ref 0–35)
AST: 15 U/L (ref 0–37)
Albumin: 4.6 g/dL (ref 3.5–5.2)
Alkaline Phosphatase: 63 U/L (ref 39–117)
BUN: 19 mg/dL (ref 6–23)
CO2: 23 mEq/L (ref 19–32)
Calcium: 9.4 mg/dL (ref 8.4–10.5)
Chloride: 103 mEq/L (ref 96–112)
Creatinine, Ser: 0.84 mg/dL (ref 0.40–1.20)
GFR: 78.62 mL/min (ref >60.00)
Glucose, Bld: 101 mg/dL — ABNORMAL HIGH (ref 70–99)
Potassium: 3.9 mEq/L (ref 3.5–5.1)
Sodium: 138 mEq/L (ref 135–145)
Total Bilirubin: 0.4 mg/dL (ref 0.2–1.2)
Total Protein: 7.4 g/dL (ref 6.0–8.3)

## 2022-11-03 LAB — TSH: TSH: 0.71 u[IU]/mL (ref 0.35–5.50)

## 2022-11-13 ENCOUNTER — Other Ambulatory Visit: Payer: Self-pay | Admitting: Family Medicine

## 2022-11-18 ENCOUNTER — Ambulatory Visit (INDEPENDENT_AMBULATORY_CARE_PROVIDER_SITE_OTHER): Payer: PRIVATE HEALTH INSURANCE | Admitting: Family Medicine

## 2022-11-18 ENCOUNTER — Encounter: Payer: Self-pay | Admitting: Family Medicine

## 2022-11-18 VITALS — BP 112/80 | HR 65 | Temp 98.3°F | Ht 61.5 in | Wt 135.6 lb

## 2022-11-18 DIAGNOSIS — Z0000 Encounter for general adult medical examination without abnormal findings: Secondary | ICD-10-CM | POA: Diagnosis not present

## 2022-11-18 DIAGNOSIS — R7309 Other abnormal glucose: Secondary | ICD-10-CM

## 2022-11-18 DIAGNOSIS — E782 Mixed hyperlipidemia: Secondary | ICD-10-CM | POA: Diagnosis not present

## 2022-11-18 LAB — POCT GLYCOSYLATED HEMOGLOBIN (HGB A1C): Hemoglobin A1C: 5.7 % — AB (ref 4.0–5.6)

## 2022-11-18 MED ORDER — ATORVASTATIN CALCIUM 20 MG PO TABS: 20.0000 mg | ORAL_TABLET | Freq: Every day | ORAL | 3 refills | Status: DC

## 2022-11-18 NOTE — Progress Notes (Signed)
Complete physical exam  Patient: Betty Foster   DOB: January 01, 1966   57 y.o. Female  MRN: [id number]  Subjective:    Chief Complaint  Patient presents with   Medical Management of Chronic Issues    Betty Foster is a 57 y.o. female who presents today for a complete physical exam. She reports consuming a general diet.  Exercises when it is warmer outside, recently bought a stationary bike at home  She generally feels well. She reports sleeping well. She does not have additional problems to discuss today.    Most recent fall risk assessment:    08/29/2014    3:15 PM  [place] in the past year? No     Most recent depression screenings:    11/18/2022    8:07 AM 05/16/2021    8:47 AM  PHQ 2/9 Scores  PHQ - 2 Score 0 0  PHQ- 9 Score  0    Pt has a regular dentist, has seen in the last year, no current dental problems  Patient Active Problem List   Diagnosis Date Noted   Prediabetes 05/20/2022   HTN (hypertension) 05/20/2022   Hypothyroidism due to Hashimoto's thyroiditis 05/17/2019   Hashimoto's thyroiditis 05/17/2019   Breast cancer of upper-outer quadrant of right female breast ([place]) 07/19/2014   Family history of breast cancer 07/19/2014   Family history of colon cancer 07/19/2014      Patient Care Team: Farrel Conners, MD as PCP - General (Family Medicine) Nicholas Lose, MD as Consulting Physician (Hematology and Oncology) [facility] (Dermatology) Hoyt Koch, MD as Referring Physician (Obstetrics and Gynecology)   Outpatient Medications Prior to Visit  Medication Sig   benazepril (LOTENSIN) 20 MG tablet TAKE 1 TABLET BY MOUTH EVERY DAY   levothyroxine (SYNTHROID) 75 MCG tablet TAKE 1 TABLET BY MOUTH EVERY DAY BEFORE BREAKFAST   tamoxifen (NOLVADEX) 20 MG tablet Take 1 tablet (20 mg total) by mouth daily.   [DISCONTINUED] atorvastatin (LIPITOR) 20 MG tablet Take 1 tablet (20 mg total) by mouth daily.    Review of  Systems  HENT:  Negative for hearing loss.   Eyes:  Negative for blurred vision.  Respiratory:  Negative for shortness of breath.   Cardiovascular:  Negative for chest pain.  Gastrointestinal: Negative.  Negative for constipation and diarrhea.  Genitourinary: Negative.   Musculoskeletal:  Negative for back pain.  Neurological:  Negative for headaches.  Psychiatric/Behavioral:  Negative for depression.           Objective:     BP 112/80 (BP Location: Left Arm, Patient Position: Sitting, Cuff Size: Normal)   Pulse 65   Temp 98.3 F (36.8 C) (Oral)   Ht 5' 1.5" (1.562 m)   Wt 135 lb 9.6 oz (61.5 kg)   LMP 05/03/2019 (Exact Date)   SpO2 98%   BMI 25.21 kg/m  BP Readings from Last 3 Encounters:  11/18/22 112/80  05/20/22 100/68  02/05/22 120/66      Physical Exam Vitals reviewed.  Constitutional:      Appearance: Normal appearance. She is well-groomed and normal weight.  HENT:     Right Ear: Tympanic membrane and ear canal normal.     Left Ear: Tympanic membrane and ear canal normal.     Mouth/Throat:     Mouth: Mucous membranes are moist.     Pharynx: No posterior oropharyngeal erythema.  Eyes:     Conjunctiva/sclera: Conjunctivae normal.  Neck:  Thyroid: No thyromegaly.  Cardiovascular:     Rate and Rhythm: Normal rate and regular rhythm.     Pulses: Normal pulses.     Heart sounds: S1 normal and S2 normal.  Pulmonary:     Effort: Pulmonary effort is normal.     Breath sounds: Normal breath sounds and air entry.  Abdominal:     General: Abdomen is flat. Bowel sounds are normal.     Palpations: Abdomen is soft.  Musculoskeletal:     Right lower leg: No edema.     Left lower leg: No edema.  Neurological:     General: No focal deficit present.     Mental Status: She is alert and oriented to person, place, and time. Mental status is at baseline.     Gait: Gait is intact.  Psychiatric:        Mood and Affect: Mood and affect normal.        Speech:  Speech normal.        Behavior: Behavior normal.        Judgment: Judgment normal.      Results for orders placed or performed in visit on 11/18/22  POC HgB A1c  Result Value Ref Range   Hemoglobin A1C 5.7 (A) 4.0 - 5.6 %   HbA1c POC (<> result, manual entry)     HbA1c, POC (prediabetic range)     HbA1c, POC (controlled diabetic range)     Last metabolic panel Lab Results  Component Value Date   GLUCOSE 101 (H) 11/03/2022   NA 138 11/03/2022   K 3.9 11/03/2022   CL 103 11/03/2022   CO2 23 11/03/2022   BUN 19 11/03/2022   CREATININE 0.84 11/03/2022   EGFR 88 (L) 12/31/2014   CALCIUM 9.4 11/03/2022   PROT 7.4 11/03/2022   ALBUMIN 4.6 11/03/2022   BILITOT 0.4 11/03/2022   ALKPHOS 63 11/03/2022   AST 15 11/03/2022   ALT 16 11/03/2022   ANIONGAP 12 (H) 12/31/2014   Last lipids Lab Results  Component Value Date   CHOL 158 11/03/2022   HDL 52.10 11/03/2022   LDLCALC 78 11/03/2022   TRIG 141.0 11/03/2022   CHOLHDL 3 11/03/2022   Last thyroid functions Lab Results  Component Value Date   TSH 0.71 11/03/2022   T3TOTAL 143 03/17/2019        Assessment & Plan:    Routine Health Maintenance and Physical Exam  Immunization History  Administered Date(s) Administered   Influenza-Unspecified 07/08/2021   Moderna Sars-Covid-2 Vaccination 10/12/2019, 11/09/2019   Td 04/23/2015   Zoster Recombinat (Shingrix) 05/15/2020, 07/30/2020    Health Maintenance  Topic Date Due   COVID-19 Vaccine (3 - Moderna risk series) 12/04/2022 (Originally 12/07/2019)   INFLUENZA VACCINE  12/27/2022 (Originally 04/28/2022)   Hepatitis C Screening  05/21/2023 (Originally 02/13/1986)   HIV Screening  05/21/2023 (Originally 02/14/1983)   MAMMOGRAM  07/23/2024   PAP SMEAR-Modifier  10/01/2024   DTaP/Tdap/Td (2 - Tdap) 04/22/2025   COLONOSCOPY (Pts 45-28yr Insurance coverage will need to be confirmed)  10/18/2028   Zoster Vaccines- Shingrix  Completed   HPV VACCINES  Aged Out    Discussed  health benefits of physical activity, and encouraged her to engage in regular exercise appropriate for her age and condition.  Problem List Items Addressed This Visit   None Visit Diagnoses     Preventative health care    -  Primary   Elevated glucose       Relevant Orders  POC HgB A1c (Completed)   Mixed hyperlipidemia       Relevant Medications   atorvastatin (LIPITOR) 20 MG tablet     Normal physical exam findings today. I reviewed her previous labs done on 11/03/2022 and she needed refill on her atorvastatin 20 mg daily which I have sent to the pharmacy. We discussed her A1C which is still borderline, I advised to reduce sugar and starches in her diet and/or increase exercise in order to reduce blood sugar. I gave handouts on preventative guidelines. Cardiac risk reduction was also discussed as a part of the dietary counseling/blood sugar reduction discussion mentioned above. HM reviewed and she is UTD on her colon and breast cancer screenings.  Return in about 6 months (around 05/19/2023) for A1C check.     Farrel Conners, MD

## 2023-01-30 NOTE — Progress Notes (Signed)
Patient Care Team: Karie Georges, MD as PCP - General (Family Medicine) Serena Croissant, MD as Consulting Physician (Hematology and Oncology) [facility] ([facility]) Carlye Grippe, MD as Referring Physician (Obstetrics and Gynecology)  DIAGNOSIS: No diagnosis found.  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 N0 M0 stage IA   09/22/2014 - 11/05/2014 Radiation Therapy   XRT   11/30/2014 -  Anti-estrogen oral therapy    tamoxifen 20 mg daily 10 years     CHIEF COMPLIANT: Follow-up of right breast cancer on tamoxifen therapy   INTERVAL HISTORY: Betty Foster is a 57 y.o. with above-mentioned history of right breast cancer treated with lumpectomy, radiation, and who is currently on tamoxifen therapy. She presents to the clinic today for a follow-up.    ALLERGIES:  has No Known Allergies.  MEDICATIONS:  Current Outpatient Medications  Medication Sig Dispense Refill   atorvastatin (LIPITOR) 20 MG tablet Take 1 tablet (20 mg total) by mouth daily. 90 tablet 3   benazepril (LOTENSIN) 20 MG tablet TAKE 1 TABLET BY MOUTH EVERY DAY 90 tablet 1   levothyroxine (SYNTHROID) 75 MCG tablet TAKE 1 TABLET BY MOUTH EVERY DAY BEFORE BREAKFAST 90 tablet 0   tamoxifen (NOLVADEX) 20 MG tablet Take 1 tablet (20 mg total) by mouth daily. 90 tablet 3   Current Facility-Administered Medications  Medication Dose Route Frequency Provider Last Rate Last Admin   0.9 %  sodium chloride infusion  500 mL Intravenous Once Mansouraty,  Netty Starring., MD        PHYSICAL EXAMINATION: ECOG PERFORMANCE STATUS: {CHL ONC ECOG ZO:[id number]}  There were no vitals filed for this visit. There were no vitals filed for this visit.  BREAST:*** No palpable masses or nodules in either right or left breasts. No palpable axillary supraclavicular or infraclavicular adenopathy no breast tenderness or nipple discharge. (exam performed in the presence of a chaperone)  LABORATORY DATA:  I have reviewed the data as listed    Latest Ref Rng & Units 11/03/2022    8:03 AM 09/18/2021    8:16 AM 07/18/2021   12:04 PM  CMP  Glucose 70 - 99 mg/dL 811  914  CANCELED   BUN 6 - 23 mg/dL 19  16    Creatinine 7.82 - 1.20 mg/dL 9.56  2.13    Sodium 086 - 145 mEq/L 138  142    Potassium 3.5 - 5.1 mEq/L 3.9  3.9    Chloride 96 - 112 mEq/L 103  104    CO2 19 - 32 mEq/L 23  31    Calcium 8.4 - 10.5 mg/dL 9.4  9.8    Total Protein 6.0 - 8.3 g/dL 7.4  7.0    Total Bilirubin 0.2 - 1.2 mg/dL 0.4  0.4    Alkaline Phos 39 - 117 U/L 63  51    AST 0 - 37 U/L 15  16    ALT 0 - 35 U/L 16  18      Lab Results  Component Value Date   WBC 5.0  05/16/2021   HGB 12.6 05/16/2021   HCT 37.2 05/16/2021   MCV 91.9 05/16/2021   PLT 186.0 05/16/2021   NEUTROABS 2.8 05/16/2021    ASSESSMENT & PLAN:  No problem-specific Assessment & Plan notes found for this encounter.    No orders of the defined types were placed in this encounter.  The patient has a good understanding of the overall plan. she agrees with it. she will call with any problems that may develop before the next visit here. Total time spent: 30 mins including face to face time and time spent for planning, charting and co-ordination of care   Sherlyn Lick, CMA 01/30/23    I Janan Ridge am acting as a Neurosurgeon for [employer]  ***

## 2023-02-02 ENCOUNTER — Ambulatory Visit: Payer: PRIVATE HEALTH INSURANCE | Admitting: Hematology and Oncology

## 2023-02-02 ENCOUNTER — Inpatient Hospital Stay: Payer: PRIVATE HEALTH INSURANCE | Attending: Hematology and Oncology | Admitting: Hematology and Oncology

## 2023-02-02 VITALS — BP 131/71 | HR 73 | Temp 97.9°F | Resp 18 | Ht 61.5 in | Wt 135.1 lb

## 2023-02-02 DIAGNOSIS — Z85820 Personal history of malignant melanoma of skin: Secondary | ICD-10-CM | POA: Insufficient documentation

## 2023-02-02 DIAGNOSIS — Z79899 Other long term (current) drug therapy: Secondary | ICD-10-CM | POA: Diagnosis not present

## 2023-02-02 DIAGNOSIS — C50411 Malignant neoplasm of upper-outer quadrant of right female breast: Secondary | ICD-10-CM | POA: Insufficient documentation

## 2023-02-02 DIAGNOSIS — Z170 Estrogen receptor positive status [ER+]: Secondary | ICD-10-CM

## 2023-02-02 DIAGNOSIS — Z79810 Long term (current) use of selective estrogen receptor modulators (SERMs): Secondary | ICD-10-CM | POA: Insufficient documentation

## 2023-02-02 DIAGNOSIS — Z923 Personal history of irradiation: Secondary | ICD-10-CM | POA: Insufficient documentation

## 2023-02-02 MED ORDER — TAMOXIFEN CITRATE 20 MG PO TABS: 20.0000 mg | ORAL_TABLET | Freq: Every day | ORAL | 3 refills | Status: DC

## 2023-02-02 NOTE — Assessment & Plan Note (Signed)
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 N0 M0 stage IA; Oncotype DX recurrence score 20, intermediate risk ROR 13% , tamoxifen started  December 2015   Tamoxifen toxicities: Monitoring closely for toxicities . 1.  Occasional hot flashes Otherwise tolerating it well. Plan to treat her with tamoxifen for 10 years   Breast cancer surveillance: 1. Annual mammograms 07/23/2022: Benign; Breast density category B 2. 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

## 2023-02-16 ENCOUNTER — Other Ambulatory Visit: Payer: Self-pay | Admitting: Family Medicine

## 2023-04-15 ENCOUNTER — Other Ambulatory Visit: Payer: Self-pay | Admitting: Family Medicine

## 2023-04-28 ENCOUNTER — Encounter (INDEPENDENT_AMBULATORY_CARE_PROVIDER_SITE_OTHER): Payer: Self-pay

## 2023-05-14 ENCOUNTER — Other Ambulatory Visit: Payer: Self-pay | Admitting: Family Medicine

## 2023-05-18 ENCOUNTER — Encounter: Payer: Self-pay | Admitting: Dermatology

## 2023-05-18 ENCOUNTER — Ambulatory Visit: Payer: PRIVATE HEALTH INSURANCE | Admitting: Dermatology

## 2023-05-18 VITALS — BP 127/82

## 2023-05-18 DIAGNOSIS — Z1283 Encounter for screening for malignant neoplasm of skin: Secondary | ICD-10-CM | POA: Diagnosis not present

## 2023-05-18 DIAGNOSIS — L738 Other specified follicular disorders: Secondary | ICD-10-CM

## 2023-05-18 DIAGNOSIS — L821 Other seborrheic keratosis: Secondary | ICD-10-CM

## 2023-05-18 DIAGNOSIS — D229 Melanocytic nevi, unspecified: Secondary | ICD-10-CM

## 2023-05-18 DIAGNOSIS — D1801 Hemangioma of skin and subcutaneous tissue: Secondary | ICD-10-CM | POA: Diagnosis not present

## 2023-05-18 DIAGNOSIS — Z85828 Personal history of other malignant neoplasm of skin: Secondary | ICD-10-CM

## 2023-05-18 DIAGNOSIS — L578 Other skin changes due to chronic exposure to nonionizing radiation: Secondary | ICD-10-CM | POA: Diagnosis not present

## 2023-05-18 DIAGNOSIS — D2272 Melanocytic nevi of left lower limb, including hip: Secondary | ICD-10-CM | POA: Diagnosis not present

## 2023-05-18 DIAGNOSIS — W908XXA Exposure to other nonionizing radiation, initial encounter: Secondary | ICD-10-CM | POA: Diagnosis not present

## 2023-05-18 DIAGNOSIS — L814 Other melanin hyperpigmentation: Secondary | ICD-10-CM

## 2023-05-18 DIAGNOSIS — E785 Hyperlipidemia, unspecified: Secondary | ICD-10-CM | POA: Insufficient documentation

## 2023-05-18 DIAGNOSIS — Z85820 Personal history of malignant melanoma of skin: Secondary | ICD-10-CM

## 2023-05-18 DIAGNOSIS — D485 Neoplasm of uncertain behavior of skin: Secondary | ICD-10-CM

## 2023-05-18 NOTE — Progress Notes (Signed)
   New Patient Visit   Subjective  Betty Foster is a 57 y.o. female who presents for the following: TBSE  Patient present today for new patient visit for TBSE. Patient reports she does not have spots of concerns today. Patient reports throughout her lifetime has had moderate to severe sun exposure. Currently, patient reports if she has excessive sun exposure, she does apply sunscreen and/or wears protective coverings. 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 Patient reports her last TBSE was prior to COVID.   The following portions of the chart were reviewed this encounter and updated as appropriate: medications, allergies, medical history  Review of Systems:  No other skin or systemic complaints except as noted in HPI or Assessment and Plan.  Objective  Well appearing patient in no apparent distress; mood and affect are within normal limits.  A full examination was performed including scalp, head, eyes, ears, nose, lips, neck, chest, axillae, abdomen, back, buttocks, bilateral upper extremities, bilateral lower extremities, hands, feet, fingers, toes, fingernails, and toenails. All findings within normal limits unless otherwise noted below.   Relevant exam findings are noted in the Assessment and Plan.  Left Inguinal Area 3 mm brown macule with Black speckle       Assessment & Plan   LENTIGINES, SEBORRHEIC KERATOSES, HEMANGIOMAS - Benign normal skin lesions - Benign-appearing - Call for any changes  MELANOCYTIC NEVI - Tan-brown and/or pink-flesh-colored symmetric macules and papules - Benign appearing on exam today - Observation - Call clinic for new or changing moles - Recommend daily use of broad spectrum spf 30+ sunscreen to sun-exposed areas.   ACTINIC DAMAGE - Chronic condition, secondary to cumulative UV/sun exposure - diffuse scaly erythematous macules with underlying  dyspigmentation - Recommend daily broad spectrum sunscreen SPF 30+ to sun-exposed areas, reapply every 2 hours as needed.  - Staying in the shade or wearing long sleeves, sun glasses (UVA+UVB protection) and wide brim hats (4-inch brim around the entire circumference of the hat) are also recommended for sun protection.  - Call for new or changing lesions.  Sebaceous Hyperplasia - Small yellow papules with a central dell - Benign-appearing - Observe. Call for changes.   SKIN CANCER SCREENING PERFORMED TODAY Exam: Well healed scar at previous MOHs, no signs of Re-occurrence  Neoplasm of uncertain behavior of skin Left Inguinal Area  Skin / nail biopsy Type of biopsy: tangential   Informed consent: discussed and consent obtained   Timeout: patient name, date of birth, surgical site, and procedure verified   Procedure prep:  Patient was prepped and draped in usual sterile fashion Prep type:  Isopropyl alcohol Anesthesia: the lesion was anesthetized in a standard fashion   Anesthetic:  1% lidocaine w/ epinephrine 1-100,000 buffered w/ 8.4% NaHCO3 Instrument used: DermaBlade   Hemostasis achieved with: aluminum chloride   Outcome: patient tolerated procedure well   Post-procedure details: sterile dressing applied and wound care instructions given   Dressing type: petrolatum gauze and bandage    - Well healed scan at central Back, No sign of Re- Occurrence    Return in about 1 year (around 05/17/2024) for TBSE.  Documentation: I have reviewed the above documentation for accuracy and completeness, and I agree with the above.  Stasia Cavalier, am acting as scribe for Langston Reusing, DO.  Langston Reusing, DO

## 2023-05-18 NOTE — Patient Instructions (Addendum)

## 2023-05-19 ENCOUNTER — Encounter: Payer: Self-pay | Admitting: Family Medicine

## 2023-05-19 ENCOUNTER — Ambulatory Visit: Payer: PRIVATE HEALTH INSURANCE | Admitting: Family Medicine

## 2023-05-19 VITALS — BP 100/62 | HR 67 | Temp 97.9°F | Ht 61.5 in | Wt 135.6 lb

## 2023-05-19 DIAGNOSIS — R7303 Prediabetes: Secondary | ICD-10-CM

## 2023-05-19 DIAGNOSIS — R7309 Other abnormal glucose: Secondary | ICD-10-CM | POA: Diagnosis not present

## 2023-05-19 DIAGNOSIS — E038 Other specified hypothyroidism: Secondary | ICD-10-CM

## 2023-05-19 DIAGNOSIS — E063 Autoimmune thyroiditis: Secondary | ICD-10-CM

## 2023-05-19 LAB — POCT GLYCOSYLATED HEMOGLOBIN (HGB A1C): Hemoglobin A1C: 5.6 % (ref 4.0–5.6)

## 2023-05-19 NOTE — Assessment & Plan Note (Signed)
On 75 mcg daily of levothyroxine, pt denies any changes in her overall health, no weight gain, no constipation or hair loss. Will recheck TSH today. Will make changes if indicated.

## 2023-05-19 NOTE — Progress Notes (Signed)
   Established Patient Office Visit  Subjective   Patient ID: Betty Foster, female    DOB: 02/02/1966  Age: 57 y.o. MRN: [id number]  Chief Complaint  Patient presents with   Medical Management of Chronic Issues    Pt is here for follow up today, no new symptoms or issues since the last visit. She reports no weight gain or constipation. States she did try to lower sugar and starches in her diet. No other issues to report.     Current Outpatient Medications  Medication Instructions   atorvastatin (LIPITOR) 20 mg, Oral, Daily   benazepril (LOTENSIN) 20 mg, Oral, Daily   levothyroxine (SYNTHROID) 75 MCG tablet TAKE 1 TABLET BY MOUTH EVERY DAY BEFORE BREAKFAST   tamoxifen (NOLVADEX) 20 mg, Oral, Daily    Patient Active Problem List   Diagnosis Date Noted   Hyperlipidemia 05/18/2023   Prediabetes 05/20/2022   HTN (hypertension) 05/20/2022   Hypothyroidism due to Hashimoto's thyroiditis 05/17/2019   Hashimoto's thyroiditis 05/17/2019   Melanoma in situ (HCC) 08/13/2015   Breast cancer of upper-outer quadrant of right female breast (HCC) 07/19/2014   Family history of breast cancer 07/19/2014   Family history of colon cancer 07/19/2014      Review of Systems  All other systems reviewed and are negative.     Objective:     BP 100/62 (BP Location: Left Arm, Patient Position: Sitting, Cuff Size: Normal)   Pulse 67   Temp 97.9 F (36.6 C) (Oral)   Ht 5' 1.5" (1.562 m)   Wt 135 lb 9.6 oz (61.5 kg)   LMP 05/03/2019 (Exact Date)   SpO2 98%   BMI 25.21 kg/m    Physical Exam Vitals reviewed.  Constitutional:      Appearance: Normal appearance. She is normal weight.  Neck:     Thyroid: No thyromegaly.  Cardiovascular:     Rate and Rhythm: Normal rate and regular rhythm.     Heart sounds: Normal heart sounds.  Pulmonary:     Effort: Pulmonary effort is normal.  Neurological:     Mental Status: She is alert and oriented to person, place, and time. Mental status is  at baseline.      Results for orders placed or performed in visit on 05/19/23  POC HgB A1c  Result Value Ref Range   Hemoglobin A1C 5.6 4.0 - 5.6 %   HbA1c POC (<> result, manual entry)     HbA1c, POC (prediabetic range)     HbA1c, POC (controlled diabetic range)        The 10-year ASCVD risk score (Arnett DK, et al., 2019) is: 1.3%    Assessment & Plan:  Elevated glucose  Prediabetes Assessment & Plan: A1C today is 5.6, pt is back to normal with dietary changes, I encouraged her to continue reducing sugar and starches in her diet. Will recheck in 6 months.   Orders: -     POCT glycosylated hemoglobin (Hb A1C)  Hypothyroidism due to Hashimoto's thyroiditis Assessment & Plan: On 75 mcg daily of levothyroxine, pt denies any changes in her overall health, no weight gain, no constipation or hair loss. Will recheck TSH today. Will make changes if indicated.   Orders: -     TSH     Return in about 6 months (around 11/19/2023) for annual physical exam.    Karie Georges, MD

## 2023-05-19 NOTE — Assessment & Plan Note (Signed)
A1C today is 5.6, pt is back to normal with dietary changes, I encouraged her to continue reducing sugar and starches in her diet. Will recheck in 6 months.

## 2023-05-21 LAB — TSH: TSH: 0.92 u[IU]/mL (ref 0.35–5.50)

## 2023-06-02 NOTE — Progress Notes (Signed)
Hi Betty Foster  Dr. Onalee Hua reviewed your biopsy results and they showed the spot removed was a little "abnormal" but not cancerous.  No additional treatment is required.  We will continue to monitor the area for re-pigmentation during your annual skin exams. The detailed report is available to view in MyChart.  Have a great day!  Kind Regards,  Dr. Kermit Balo Care Team

## 2023-06-06 ENCOUNTER — Encounter: Payer: Self-pay | Admitting: Dermatology

## 2023-06-11 ENCOUNTER — Other Ambulatory Visit: Payer: Self-pay | Admitting: Hematology and Oncology

## 2023-06-11 DIAGNOSIS — Z1231 Encounter for screening mammogram for malignant neoplasm of breast: Secondary | ICD-10-CM

## 2023-07-27 ENCOUNTER — Ambulatory Visit: Payer: PRIVATE HEALTH INSURANCE

## 2023-07-29 ENCOUNTER — Ambulatory Visit
Admission: RE | Admit: 2023-07-29 | Discharge: 2023-07-29 | Disposition: A | Discharge status: Home / Self Care | Payer: PRIVATE HEALTH INSURANCE | Source: Ambulatory Visit | Attending: Hematology and Oncology | Admitting: Hematology and Oncology

## 2023-07-29 DIAGNOSIS — Z1231 Encounter for screening mammogram for malignant neoplasm of breast: Secondary | ICD-10-CM

## 2023-08-11 ENCOUNTER — Other Ambulatory Visit: Payer: Self-pay | Admitting: Family Medicine

## 2023-09-30 ENCOUNTER — Other Ambulatory Visit: Payer: Self-pay | Admitting: Family Medicine

## 2023-09-30 DIAGNOSIS — E782 Mixed hyperlipidemia: Secondary | ICD-10-CM

## 2023-10-15 ENCOUNTER — Other Ambulatory Visit: Payer: Self-pay | Admitting: Family Medicine

## 2023-11-18 ENCOUNTER — Encounter: Payer: PRIVATE HEALTH INSURANCE | Admitting: Family Medicine

## 2023-12-03 ENCOUNTER — Ambulatory Visit (INDEPENDENT_AMBULATORY_CARE_PROVIDER_SITE_OTHER): Payer: PRIVATE HEALTH INSURANCE | Admitting: Family Medicine

## 2023-12-03 ENCOUNTER — Encounter: Payer: Self-pay | Admitting: Family Medicine

## 2023-12-03 VITALS — BP 122/78 | HR 60 | Temp 98.0°F | Ht 61.5 in | Wt 136.7 lb

## 2023-12-03 DIAGNOSIS — Z0000 Encounter for general adult medical examination without abnormal findings: Secondary | ICD-10-CM

## 2023-12-03 DIAGNOSIS — E782 Mixed hyperlipidemia: Secondary | ICD-10-CM

## 2023-12-03 DIAGNOSIS — E063 Autoimmune thyroiditis: Secondary | ICD-10-CM

## 2023-12-03 DIAGNOSIS — I10 Essential (primary) hypertension: Secondary | ICD-10-CM

## 2023-12-03 DIAGNOSIS — R7303 Prediabetes: Secondary | ICD-10-CM | POA: Diagnosis not present

## 2023-12-03 LAB — COMPREHENSIVE METABOLIC PANEL
ALT: 17 U/L (ref 0–35)
AST: 16 U/L (ref 0–37)
Albumin: 4.5 g/dL (ref 3.5–5.2)
Alkaline Phosphatase: 66 U/L (ref 39–117)
BUN: 13 mg/dL (ref 6–23)
CO2: 28 meq/L (ref 19–32)
Calcium: 9.8 mg/dL (ref 8.4–10.5)
Chloride: 106 meq/L (ref 96–112)
Creatinine, Ser: 0.81 mg/dL (ref 0.40–1.20)
GFR: 81.5 mL/min (ref >60.00)
Glucose, Bld: 89 mg/dL (ref 70–99)
Potassium: 4.2 meq/L (ref 3.5–5.1)
Sodium: 143 meq/L (ref 135–145)
Total Bilirubin: 0.4 mg/dL (ref 0.2–1.2)
Total Protein: 7.4 g/dL (ref 6.0–8.3)

## 2023-12-03 LAB — CBC WITH DIFFERENTIAL/PLATELET
Basophils Absolute: 0.1 10*3/uL (ref 0.0–0.1)
Basophils Relative: 0.9 % (ref 0.0–3.0)
Eosinophils Absolute: 0.2 10*3/uL (ref 0.0–0.7)
Eosinophils Relative: 3.3 % (ref 0.0–5.0)
HCT: 36.7 % (ref 36.0–46.0)
Hemoglobin: 12.4 g/dL (ref 12.0–15.0)
Lymphocytes Relative: 35 % (ref 12.0–46.0)
Lymphs Abs: 2.1 10*3/uL (ref 0.7–4.0)
MCHC: 33.8 g/dL (ref 30.0–36.0)
MCV: 92.9 fl (ref 78.0–100.0)
Monocytes Absolute: 0.4 10*3/uL (ref 0.1–1.0)
Monocytes Relative: 6.2 % (ref 3.0–12.0)
Neutro Abs: 3.2 10*3/uL (ref 1.4–7.7)
Neutrophils Relative %: 54.6 % (ref 43.0–77.0)
Platelets: 196 10*3/uL (ref 150.0–400.0)
RBC: 3.95 Mil/uL (ref 3.87–5.11)
RDW: 14.2 % (ref 11.5–15.5)
WBC: 5.9 10*3/uL (ref 4.0–10.5)

## 2023-12-03 LAB — LIPID PANEL
Cholesterol: 159 mg/dL (ref 0–200)
HDL: 47.2 mg/dL (ref >39.00)
LDL Cholesterol: 76 mg/dL (ref 0–99)
NonHDL: 111.6
Total CHOL/HDL Ratio: 3
Triglycerides: 178 mg/dL — ABNORMAL HIGH (ref 0.0–149.0)
VLDL: 35.6 mg/dL (ref 0.0–40.0)

## 2023-12-03 LAB — TSH: TSH: 0.74 u[IU]/mL (ref 0.35–5.50)

## 2023-12-03 LAB — HEMOGLOBIN A1C: Hgb A1c MFr Bld: 5.6 % (ref 4.6–6.5)

## 2023-12-03 NOTE — Progress Notes (Signed)
 Complete physical exam  Patient: Betty Foster   DOB: [birth year]   58 y.o. Female  MRN: [id number]  Subjective:    Chief Complaint  Patient presents with   Annual Exam    Betty Foster is a 58 y.o. female who presents today for a complete physical exam. She reports consuming a general diet. Pt is taking "fruit and veggie supplement". Home exercise routine includes walking 0.5 hrs per day. She generally feels well. She reports sleeping well. She does not have additional problems to discuss today.    Most recent fall risk assessment:    08/29/2014    3:15 PM  Fall Risk   Falls in the past year? No     Most recent depression screenings:    12/03/2023    9:02 AM 05/19/2023    8:09 AM  PHQ 2/9 Scores  PHQ - 2 Score 0 0  PHQ- 9 Score 0 0    Vision:Not within last year  and wears readers and Dental: No current dental problems and Receives regular dental care  Patient Active Problem List   Diagnosis Date Noted   Hyperlipidemia 05/18/2023   Prediabetes 05/20/2022   HTN (hypertension) 05/20/2022   Hypothyroidism due to Hashimoto's thyroiditis 05/17/2019   Hashimoto's thyroiditis 05/17/2019   Melanoma in situ (HCC) 08/13/2015   Breast cancer of upper-outer quadrant of right female breast (HCC) 07/19/2014   Family history of breast cancer 07/19/2014   Family history of colon cancer 07/19/2014      Patient Care Team: Karie Georges, MD as PCP - General (Family Medicine) Serena Croissant, MD as Consulting Physician (Hematology and Oncology) [facility] (Inactive) (Dermatology) Carlye Grippe, MD as Referring Physician (Obstetrics and Gynecology)   Outpatient Medications Prior to Visit  Medication Sig   atorvastatin (LIPITOR) 20 MG tablet TAKE 1 TABLET BY MOUTH EVERY DAY   benazepril (LOTENSIN) 20 MG tablet TAKE 1 TABLET BY MOUTH EVERY DAY   levothyroxine (SYNTHROID) 75 MCG tablet TAKE 1 TABLET BY MOUTH EVERY DAY BEFORE BREAKFAST   tamoxifen  (NOLVADEX) 20 MG tablet Take 1 tablet (20 mg total) by mouth daily.   Facility-Administered Medications Prior to Visit  Medication Dose Route Frequency Provider   0.9 %  sodium chloride infusion  500 mL Intravenous Once Mansouraty, Netty Starring., MD    Review of Systems  HENT:  Negative for hearing loss.   Eyes:  Negative for blurred vision.  Respiratory:  Negative for shortness of breath.   Cardiovascular:  Negative for chest pain.  Gastrointestinal: Negative.   Genitourinary: Negative.   Musculoskeletal:  Negative for back pain.  Neurological:  Negative for headaches.  Psychiatric/Behavioral:  Negative for depression.        Objective:     BP 122/78   Pulse 60   Temp 98 F (36.7 C) (Oral)   Ht 5' 1.5" (1.562 m)   Wt 136 lb 11.2 oz (62 kg)   LMP 05/03/2019 (Exact Date)   SpO2 99%   BMI 25.41 kg/m    Physical Exam Vitals reviewed.  Constitutional:      Appearance: Normal appearance. She is well-groomed and normal weight.  HENT:     Right Ear: Tympanic membrane and ear canal normal.     Left Ear: Tympanic membrane and ear canal normal.     Mouth/Throat:     Mouth: Mucous membranes are moist.     Pharynx: No posterior oropharyngeal erythema.  Eyes:     Conjunctiva/sclera: Conjunctivae normal.  Neck:     Thyroid: No thyromegaly.  Cardiovascular:     Rate and Rhythm: Normal rate and regular rhythm.     Pulses: Normal pulses.     Heart sounds: S1 normal and S2 normal.  Pulmonary:     Effort: Pulmonary effort is normal.     Breath sounds: Normal breath sounds and air entry.  Abdominal:     General: Abdomen is flat. Bowel sounds are normal.     Palpations: Abdomen is soft.  Musculoskeletal:     Right lower leg: No edema.     Left lower leg: No edema.  Lymphadenopathy:     Cervical: No cervical adenopathy.  Neurological:     Mental Status: She is alert and oriented to person, place, and time. Mental status is at baseline.     Gait: Gait is intact.   Psychiatric:        Mood and Affect: Mood and affect normal.        Speech: Speech normal.        Behavior: Behavior normal.        Judgment: Judgment normal.      No results found for any visits on 12/03/23.     Assessment & Plan:    Routine Health Maintenance and Physical Exam  Immunization History  Administered Date(s) Administered   Influenza-Unspecified 07/08/2021, 05/30/2023   Moderna Sars-Covid-2 Vaccination 10/12/2019, 11/09/2019   Td 04/23/2015   Zoster Recombinant(Shingrix) 05/15/2020, 07/30/2020    Health Maintenance  Topic Date Due   COVID-19 Vaccine (3 - Moderna risk series) 12/07/2019   Hepatitis C Screening  12/02/2024 (Originally 02/13/1984)   HIV Screening  12/02/2024 (Originally 02[birth year]14/1983)   Cervical Cancer Screening (HPV/Pap Cotest)  10/01/2024   DTaP/Tdap/Td (2 - Tdap) 04/22/2025   MAMMOGRAM  07/28/2025   Colonoscopy  10/18/2028   INFLUENZA VACCINE  Completed   Zoster Vaccines- Shingrix  Completed   HPV VACCINES  Aged Out    Discussed health benefits of physical activity, and encouraged her to engage in regular exercise appropriate for her age and condition.  Primary hypertension -     Comprehensive metabolic panel; Future  Mixed hyperlipidemia -     Lipid panel; Future  Hypothyroidism due to Hashimoto's thyroiditis -     CBC with Differential/Platelet; Future -     TSH; Future  Prediabetes -     Hemoglobin A1c; Future  Routine general medical examination at a health care facility  Normal physical exam findings, she is due for her annual blood work for surveillance. I counseled the patient on the CDC recommendations for physical activity, handouts were given on healthy eating and exercise.   Return in about 1 year (around 12/02/2024) for annual physical exam.     Karie Georges, MD

## 2023-12-03 NOTE — Patient Instructions (Addendum)

## 2023-12-08 ENCOUNTER — Encounter: Payer: Self-pay | Admitting: Family Medicine

## 2023-12-25 ENCOUNTER — Other Ambulatory Visit: Payer: Self-pay | Admitting: Family Medicine

## 2023-12-25 DIAGNOSIS — E782 Mixed hyperlipidemia: Secondary | ICD-10-CM

## 2023-12-27 ENCOUNTER — Encounter: Payer: Self-pay | Admitting: Dermatology

## 2023-12-28 NOTE — Telephone Encounter (Signed)
 Hi Jetta, Please let pt know that I can't perform a full evaluation, diagnosis and treatment plan without more context and physical exam in person.  If she's symptomatic of concerned we can see if there's something available end of April or May

## 2024-01-06 ENCOUNTER — Ambulatory Visit: Payer: PRIVATE HEALTH INSURANCE | Admitting: Dermatology

## 2024-01-19 ENCOUNTER — Ambulatory Visit: Payer: PRIVATE HEALTH INSURANCE | Admitting: Dermatology

## 2024-01-27 ENCOUNTER — Ambulatory Visit (INDEPENDENT_AMBULATORY_CARE_PROVIDER_SITE_OTHER): Payer: PRIVATE HEALTH INSURANCE | Admitting: Dermatology

## 2024-01-27 ENCOUNTER — Encounter: Payer: Self-pay | Admitting: Dermatology

## 2024-01-27 VITALS — BP 124/85

## 2024-01-27 DIAGNOSIS — M7989 Other specified soft tissue disorders: Secondary | ICD-10-CM | POA: Diagnosis not present

## 2024-01-27 DIAGNOSIS — M79673 Pain in unspecified foot: Secondary | ICD-10-CM | POA: Diagnosis not present

## 2024-01-27 DIAGNOSIS — D692 Other nonthrombocytopenic purpura: Secondary | ICD-10-CM | POA: Diagnosis not present

## 2024-01-27 DIAGNOSIS — I776 Arteritis, unspecified: Secondary | ICD-10-CM

## 2024-01-27 MED ORDER — TACROLIMUS 0.1 % EX OINT
TOPICAL_OINTMENT | Freq: Two times a day (BID) | CUTANEOUS | 1 refills | Status: AC
Start: 2024-01-27 — End: ?

## 2024-01-27 MED ORDER — TRIAMCINOLONE ACETONIDE 0.1 % EX OINT: 1.0000 | TOPICAL_OINTMENT | Freq: Two times a day (BID) | CUTANEOUS | 1 refills | Status: AC

## 2024-01-27 NOTE — Progress Notes (Unsigned)
   Follow-Up Visit   Subjective  Betty Foster is a 58 y.o. female who presents for the following: Foot Issue  Patient present today for follow up visit. Patient was last evaluated on 05/18/23 for TBSE. At this visit patient was not prescribed medications. Patient reports for new issue today. Pt stated that she has red "splotchy" marks on her feet that started at the end of March. The areas are not painful and she has not tried anything OTC for the areas. She stated some days it is moe prominent that others. Patient denies medication changes.  The following portions of the chart were reviewed this encounter and updated as appropriate: medications, allergies, medical history  Review of Systems:  No other skin or systemic complaints except as noted in HPI or Assessment and Plan.  Objective  Well appearing patient in no apparent distress; mood and affect are within normal limits.   A focused examination was performed of the following areas: B/L feet   Relevant exam findings are noted in the Assessment and Plan.                Assessment & Plan   VASCULITIS Exam: Petechiae with non-blanchable areas involving B/L inner soles of feet and lower legs  Treatment Plan: - Labs ordered today - Rx TMC & Tacrolimus  - alternating use every 2 weeks- apply topical BID to affected areas.    VASCULITIS (HCC)    Return in about 2 months (around 03/28/2024) for vasculitis.    Documentation: I have reviewed the above documentation for accuracy and completeness, and I agree with the above.  I, Shirron Louanne Roussel, CMA, am acting as scribe for [employer], DO.   Louana Roup, DO

## 2024-01-27 NOTE — Patient Instructions (Addendum)
 Hello Betty Foster,  Thank you for visiting today. Here is a summary of the key instructions:  - Medications:   - Use triamcinolone  cream twice a day for 2 weeks   - Alternate with tacrolimus  cream twice a day for 2 weeks   - Take Aleve every 12 hours for 3-4 days with food  - Lab Tests:   - ANA screen for lupus   - C-ANCA and P-ANCA tests   - Also Tests to rule out scleroderma and Sjogren's  - For Swollen Foot:   - Keep foot elevated   - Take Aleve or Advil as directed above  - Follow-up:   - Return in 2 months for a check-up   - Message on MyChart if any changes occur before then  Please reach out if you have any questions or concerns.  Warm regards,  Dr. Louana Roup, Dermatology Important Information  Due to recent changes in healthcare laws, you may see results of your pathology and/or laboratory studies on MyChart before the doctors have had a chance to review them. We understand that in some cases there may be results that are confusing or concerning to you. Please understand that not all results are received at the same time and often the doctors may need to interpret multiple results in order to provide you with the best plan of care or course of treatment. Therefore, we ask that you please give us  2 business days to thoroughly review all your results before contacting the office for clarification. Should we see a critical lab result, you will be contacted sooner.   If You Need Anything After Your Visit  If you have any questions or concerns for your doctor, please call our main line at [phone] If no one answers, please leave a voicemail as directed and we will return your call as soon as possible. Messages left after 4 pm will be answered the following business day.   You may also send us  a message via MyChart. We typically respond to MyChart messages within 1-2 business days.  For prescription refills, please ask your pharmacy to contact our office. Our fax number  is [phone].  If you have an urgent issue when the clinic is closed that cannot wait until the next business day, you can page your doctor at the number below.    Please note that while we do our best to be available for urgent issues outside of office hours, we are not available 24/7.   If you have an urgent issue and are unable to reach us , you may choose to seek medical care at your doctor's office, retail clinic, urgent care center, or emergency room.  If you have a medical emergency, please immediately call 911 or go to the emergency department. In the event of inclement weather, please call our main line at [phone] for an update on the status of any delays or closures.  Dermatology Medication Tips: Please keep the boxes that topical medications come in in order to help keep track of the instructions about where and how to use these. Pharmacies typically print the medication instructions only on the boxes and not directly on the medication tubes.   If your medication is too expensive, please contact our office at [phone] or send us  a message through MyChart.   We are unable to tell what your co-pay for medications will be in advance as this is different depending on your insurance coverage. However, we may be able to find a substitute  medication at lower cost or fill out paperwork to get insurance to cover a needed medication.   If a prior authorization is required to get your medication covered by your insurance company, please allow us  1-2 business days to complete this process.  Drug prices often vary depending on where the prescription is filled and some pharmacies may offer cheaper prices.  The website www.goodrx.com contains coupons for medications through different pharmacies. The prices here do not account for what the cost may be with help from insurance (it may be cheaper with your insurance), but the website can give you the price if you did not use any insurance.   - You can print the associated coupon and take it with your prescription to the pharmacy.  - You may also stop by our office during regular business hours and pick up a GoodRx coupon card.  - If you need your prescription sent electronically to a different pharmacy, notify our office through [facility] or by phone at [phone]

## 2024-01-31 ENCOUNTER — Ambulatory Visit: Payer: PRIVATE HEALTH INSURANCE | Admitting: Dermatology

## 2024-02-02 ENCOUNTER — Inpatient Hospital Stay: Payer: PRIVATE HEALTH INSURANCE | Attending: Hematology and Oncology | Admitting: Hematology and Oncology

## 2024-02-02 ENCOUNTER — Other Ambulatory Visit: Payer: Self-pay | Admitting: Family Medicine

## 2024-02-02 VITALS — BP 118/78 | HR 74 | Temp 97.7°F | Resp 18 | Ht 65.0 in | Wt 137.3 lb

## 2024-02-02 DIAGNOSIS — Z170 Estrogen receptor positive status [ER+]: Secondary | ICD-10-CM | POA: Diagnosis not present

## 2024-02-02 DIAGNOSIS — Z923 Personal history of irradiation: Secondary | ICD-10-CM | POA: Diagnosis not present

## 2024-02-02 DIAGNOSIS — C50411 Malignant neoplasm of upper-outer quadrant of right female breast: Secondary | ICD-10-CM | POA: Insufficient documentation

## 2024-02-02 DIAGNOSIS — Z79810 Long term (current) use of selective estrogen receptor modulators (SERMs): Secondary | ICD-10-CM | POA: Insufficient documentation

## 2024-02-02 DIAGNOSIS — Z85820 Personal history of malignant melanoma of skin: Secondary | ICD-10-CM | POA: Diagnosis not present

## 2024-02-02 DIAGNOSIS — Z1732 Human epidermal growth factor receptor 2 negative status: Secondary | ICD-10-CM | POA: Diagnosis not present

## 2024-02-02 MED ORDER — TAMOXIFEN CITRATE 20 MG PO TABS: 20.0000 mg | ORAL_TABLET | Freq: Every day | ORAL | 1 refills | Status: DC

## 2024-02-02 NOTE — Assessment & Plan Note (Signed)
 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 1.  Occasional hot flashes Otherwise tolerating it well. Plan to treat her with tamoxifen  for 10 years   Breast cancer surveillance: 1. Annual mammograms 08/02/2023: Benign; Breast density category B 2. 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

## 2024-02-02 NOTE — Progress Notes (Signed)
 Patient Care Team: Aida House, MD as PCP - General (Family Medicine) Cameron Cea, MD as Consulting Physician (Hematology and Oncology) [facility]In[facility] (Dermatology) Leandra Pro, MD as Referring Physician (Obstetrics and Gynecology)  DIAGNOSIS:  Encounter Diagnosis  Name Primary?   Malignant neoplasm of upper-outer quadrant of right breast in female, estrogen receptor positive (HCC) Yes    SUMMARY OF ONCOLOGIC HISTORY: Oncology History  Breast cancer of upper-outer quadrant of right female breast (HCC)  07/02/2014 Initial Diagnosis   Infiltrating lobular carcinoma right breast grade 2   07/17/2014 Breast MRI   Small spiculated mass 1.5 cm and right axillary tail, no lymph nodes, small hepatic cysts probably benign   08/03/2014 Surgery   Right breast lumpectomy: Invasive lobular cancer, grade 2, 1.5 cm, 2 SLN negative, ER/PR positive HER-2 negative, tumor size 1.5 cm, T1 C. N0 M0 stage IA   09/22/2014 - 11/05/2014 Radiation Therapy   XRT   11/30/2014 -  Anti-estrogen oral therapy    tamoxifen  20 mg daily 10 years     CHIEF COMPLIANT: Follow-up on tamoxifen   HISTORY OF PRESENT ILLNESS:  History of Present Illness Betty Foster is a 58 year old female with a history of melanoma who presents for a follow-up regarding dermatological concerns and medication management.  She experiences reticular splotches on her skin, which are under evaluation with blood work. Her history of melanoma on the thigh remains stable with regular monitoring. She uses prescribed creams for her lips as part of her treatment.  She has been on tamoxifen  for nearly ten years and is reviewing its side effects, including potential skin changes. She is considering the timeline for discontinuing this medication.     ALLERGIES:  has no known allergies.  MEDICATIONS:  Current Outpatient Medications  Medication Sig Dispense Refill   atorvastatin  (LIPITOR) 20 MG  tablet TAKE 1 TABLET BY MOUTH EVERY DAY 90 tablet 1   benazepril  (LOTENSIN ) 20 MG tablet TAKE 1 TABLET BY MOUTH EVERY DAY 90 tablet 1   levothyroxine  (SYNTHROID ) 75 MCG tablet TAKE 1 TABLET BY MOUTH EVERY DAY BEFORE BREAKFAST 90 tablet 1   tacrolimus  (PROTOPIC ) 0.1 % ointment Apply topically 2 (two) times daily. Use for 2 weeks, then alternate with TMC. 100 g 1   tamoxifen  (NOLVADEX ) 20 MG tablet Take 1 tablet (20 mg total) by mouth daily. 90 tablet 1   triamcinolone  ointment (KENALOG ) 0.1 % Apply 1 Application topically 2 (two) times daily. Use for 2 weeks, the alternate with tacrolimus  80 g 1   Current Facility-Administered Medications  Medication Dose Route Frequency Provider Last Rate Last Admin   0.9 %  sodium chloride  infusion  500 mL Intravenous Once Mansouraty, Albino Alu., MD        PHYSICAL EXAMINATION: ECOG PERFORMANCE STATUS: 1 - Symptomatic but completely ambulatory  Vitals:   02/02/24 0834  BP: 118/78  Pulse: 74  Resp: 18  Temp: 97.7 F (36.5 C)  SpO2: 99%   Filed Weights   02/02/24 0834  Weight: 137 lb 4.8 oz (62.3 kg)    Physical Exam GENERAL: Normal exam findings  (exam performed in the presence of a chaperone)  LABORATORY DATA:  I have reviewed the data as listed    Latest Ref Rng & Units 12/03/2023    9:58 AM 11/03/2022    8:03 AM 09/18/2021    8:16 AM  CMP  Glucose 70 - 99 mg/dL 89  161  096   BUN 6 - 23 mg/dL  13  19  16    Creatinine 0.40 - 1.20 mg/dL 0.98  1.19  1.47   Sodium 135 - 145 mEq/L 143  138  142   Potassium 3.5 - 5.1 mEq/L 4.2  3.9  3.9   Chloride 96 - 112 mEq/L 106  103  104   CO2 19 - 32 mEq/L 28  23  31    Calcium  8.4 - 10.5 mg/dL 9.8  9.4  9.8   Total Protein 6.0 - 8.3 g/dL 7.4  7.4  7.0   Total Bilirubin 0.2 - 1.2 mg/dL 0.4  0.4  0.4   Alkaline Phos 39 - 117 U/L 66  63  51   AST 0 - 37 U/L 16  15  16    ALT 0 - 35 U/L 17  16  18      Lab Results  Component Value Date   WBC 5.9 12/03/2023   HGB 12.4 12/03/2023   HCT 36.7  12/03/2023   MCV 92.9 12/03/2023   PLT 196.0 12/03/2023   NEUTROABS 3.2 12/03/2023    ASSESSMENT & PLAN:  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 1.  Occasional hot flashes Otherwise tolerating it well. Plan to treat her with tamoxifen  for 10 years   Breast cancer surveillance: 1. Annual mammograms 08/02/2023: Benign; Breast density category B 2. 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 Mammograms show reduced breast density, likely due to tamoxifen . Discussed potential improvement in energy and skin dryness after discontinuing tamoxifen . Comfortable with transitioning to annual mammograms and follow-up as needed. - Refill tamoxifen  prescription to last until December, then discontinue. - Schedule follow-up six months after stopping tamoxifen . - Continue annual mammograms and follow-up as needed.  Melanoma Melanoma on the thigh with current skin changes described as reticular splotches. Blood work conducted to investigate these changes. No pain, but cosmetically concerning. - Await blood work results. - Continue using prescribed creams for the lips.      No orders of the defined types were placed in this encounter.  The patient has a good understanding of the overall plan. she agrees with it. she will call with any problems that may develop before the next visit here. Total time spent: 30 mins including face to face time and time spent for planning, charting and co-ordination of care   Betty  K Salimah Martinovich, MD 02/02/24

## 2024-02-05 LAB — ENA+DNA/DS+ANTICH+CENTRO+FA...
Antiribosomal P Antibodies: <0.2 AI (ref 0.0–0.9)
Centromere Ab Screen: <0.2 AI (ref 0.0–0.9)
Chromatin Ab SerPl-aCnc: <0.2 AI (ref 0.0–0.9)
ENA RNP Ab: <0.2 AI (ref 0.0–0.9)
ENA SM Ab Ser-aCnc: <0.2 AI (ref 0.0–0.9)
ENA SSA (RO) Ab: <0.2 AI (ref 0.0–0.9)
ENA SSB (LA) Ab: <0.2 AI (ref 0.0–0.9)
Homogeneous Pattern: 1:320 {titer} — ABNORMAL HIGH
Smith/RNP Antibodies: <0.2 AI (ref 0.0–0.9)
dsDNA Ab: 1 [IU]/mL (ref 0–9)

## 2024-02-05 LAB — ANTI-SCLERODERMA ANTIBODY: Scleroderma (Scl-70) (ENA) Antibody, IgG: <0.2 AI (ref 0.0–0.9)

## 2024-02-05 LAB — COMPREHENSIVE METABOLIC PANEL WITH GFR
ALT: 23 IU/L (ref 0–32)
AST: 16 IU/L (ref 0–40)
Albumin: 4.5 g/dL (ref 3.8–4.9)
Alkaline Phosphatase: 79 IU/L (ref 44–121)
BUN/Creatinine Ratio: 15 (ref 9–23)
BUN: 15 mg/dL (ref 6–24)
Bilirubin Total: 0.5 mg/dL (ref 0.0–1.2)
CO2: 24 mmol/L (ref 20–29)
Calcium: 9.6 mg/dL (ref 8.7–10.2)
Chloride: 101 mmol/L (ref 96–106)
Creatinine, Ser: 0.99 mg/dL (ref 0.57–1.00)
Globulin, Total: 2.5 g/dL (ref 1.5–4.5)
Glucose: 89 mg/dL (ref 70–99)
Potassium: 4.3 mmol/L (ref 3.5–5.2)
Sodium: 140 mmol/L (ref 134–144)
Total Protein: 7 g/dL (ref 6.0–8.5)
eGFR: 67 mL/min/{1.73_m2} (ref >59)

## 2024-02-05 LAB — CBC
Hematocrit: 35.7 % (ref 34.0–46.6)
Hemoglobin: 12 g/dL (ref 11.1–15.9)
MCH: 30.8 pg (ref 26.6–33.0)
MCHC: 33.6 g/dL (ref 31.5–35.7)
MCV: 92 fL (ref 79–97)
Platelets: 192 10*3/uL (ref 150–450)
RBC: 3.9 x10E6/uL (ref 3.77–5.28)
RDW: 12.6 % (ref 11.7–15.4)
WBC: 6.7 10*3/uL (ref 3.4–10.8)

## 2024-02-05 LAB — ANCA PROFILE
Anti-MPO Antibodies: <0.2 U (ref 0.0–0.9)
Anti-PR3 Antibodies: <0.2 U (ref 0.0–0.9)
Atypical pANCA: <1:20 {titer}
C-ANCA: <1:20 {titer}
P-ANCA: <1:20 {titer}

## 2024-02-05 LAB — ANTI-JO 1 ANTIBODY, IGG: Anti JO-1: <0.2 AI (ref 0.0–0.9)

## 2024-02-05 LAB — ANA W/REFLEX: ANA Titer 1: POSITIVE — AB

## 2024-02-07 ENCOUNTER — Telehealth: Payer: Self-pay

## 2024-02-07 ENCOUNTER — Encounter: Payer: Self-pay | Admitting: Dermatology

## 2024-02-07 DIAGNOSIS — M2550 Pain in unspecified joint: Secondary | ICD-10-CM

## 2024-02-07 DIAGNOSIS — R768 Other specified abnormal immunological findings in serum: Secondary | ICD-10-CM

## 2024-02-07 DIAGNOSIS — I776 Arteritis, unspecified: Secondary | ICD-10-CM

## 2024-02-07 NOTE — Progress Notes (Signed)
 Good morning Betty Foster.  Please reference pt message I sent and send a referral to Rheumatology.  Reason for referral will be vasculitis, joint pain and positive ANA.

## 2024-02-07 NOTE — Telephone Encounter (Signed)
 Referral entered

## 2024-02-07 NOTE — Telephone Encounter (Signed)
-----   Message from Louana Roup sent at 02/07/2024  9:18 AM EDT ----- Good morning Claris Guymon.  Please reference pt message I sent and send a referral to Rheumatology.  Reason for referral will be vasculitis, joint pain and positive ANA.

## 2024-04-07 ENCOUNTER — Other Ambulatory Visit: Payer: Self-pay | Admitting: Family Medicine

## 2024-04-25 ENCOUNTER — Ambulatory Visit: Payer: PRIVATE HEALTH INSURANCE | Admitting: Dermatology

## 2024-05-17 ENCOUNTER — Ambulatory Visit: Payer: PRIVATE HEALTH INSURANCE | Admitting: Dermatology

## 2024-05-18 ENCOUNTER — Encounter: Payer: Self-pay | Admitting: Dermatology

## 2024-05-18 ENCOUNTER — Ambulatory Visit (INDEPENDENT_AMBULATORY_CARE_PROVIDER_SITE_OTHER): Payer: PRIVATE HEALTH INSURANCE | Admitting: Dermatology

## 2024-05-18 VITALS — BP 105/71 | HR 65

## 2024-05-18 DIAGNOSIS — W908XXA Exposure to other nonionizing radiation, initial encounter: Secondary | ICD-10-CM

## 2024-05-18 DIAGNOSIS — L570 Actinic keratosis: Secondary | ICD-10-CM

## 2024-05-18 DIAGNOSIS — L814 Other melanin hyperpigmentation: Secondary | ICD-10-CM | POA: Diagnosis not present

## 2024-05-18 DIAGNOSIS — D229 Melanocytic nevi, unspecified: Secondary | ICD-10-CM

## 2024-05-18 DIAGNOSIS — L821 Other seborrheic keratosis: Secondary | ICD-10-CM

## 2024-05-18 DIAGNOSIS — Z1283 Encounter for screening for malignant neoplasm of skin: Secondary | ICD-10-CM | POA: Diagnosis not present

## 2024-05-18 DIAGNOSIS — D485 Neoplasm of uncertain behavior of skin: Secondary | ICD-10-CM | POA: Diagnosis not present

## 2024-05-18 DIAGNOSIS — D1801 Hemangioma of skin and subcutaneous tissue: Secondary | ICD-10-CM | POA: Diagnosis not present

## 2024-05-18 DIAGNOSIS — L578 Other skin changes due to chronic exposure to nonionizing radiation: Secondary | ICD-10-CM

## 2024-05-18 DIAGNOSIS — L738 Other specified follicular disorders: Secondary | ICD-10-CM

## 2024-05-18 NOTE — Patient Instructions (Signed)

## 2024-05-18 NOTE — Progress Notes (Signed)
 Follow-Up Visit   Subjective  Betty Foster is a 58 y.o. female who presents for the following: Skin Cancer Screening and Full Body Skin Exam   Patient presents today for follow up visit for TBSE. Patient was last evaluated on 01/27/24 . Patient denies medication changes. Patient reports she does not have spots, moles and lesions of concern to be evaluated. Patient reports throughout her lifetime she has had moderate sun exposure. Currently, patient reports if she has excessive sun exposure, she does apply sunscreen and/or wears protective coverings. Patient reports she has hx of bx (Mardella's last annual skin exam was 05/18/23. She had a mole removed from the left inguinal area which resulted as DN abnormal but no further treatment was necessary. Hx of MOHS for a melanoma of L Upper Thigh and BCC at the Mid Back). Patient reports family history of skin cancers (Dad had Melanoma). The patient has spots, moles and lesions to be evaluated, some may be new or changing and the patient has concerns that these could be cancer.   The following portions of the chart were reviewed this encounter and updated as appropriate: medications, allergies, medical history  Review of Systems:  No other skin or systemic complaints except as noted in HPI or Assessment and Plan.  Objective  Well appearing patient in no apparent distress; mood and affect are within normal limits.  A full examination was performed including scalp, head, eyes, ears, nose, lips, neck, chest, axillae, abdomen, back, buttocks, bilateral upper extremities, bilateral lower extremities, hands, feet, fingers, toes, fingernails, and toenails. All findings within normal limits unless otherwise noted below.   Relevant physical exam findings are noted in the Assessment and Plan.       Left Thigh - Posterior 5 mm Flat Top Papule  Assessment & Plan   SKIN CANCER SCREENING PERFORMED TODAY.  ACTINIC DAMAGE - Chronic condition, secondary to  cumulative UV/sun exposure - diffuse scaly erythematous macules with underlying dyspigmentation - Recommend daily broad spectrum sunscreen SPF 30+ to sun-exposed areas, reapply every 2 hours as needed.  - Staying in the shade or wearing long sleeves, sun glasses (UVA+UVB protection) and wide brim hats (4-inch brim around the entire circumference of the hat) are also recommended for sun protection.  - Call for new or changing lesions.  LENTIGINES, SEBORRHEIC KERATOSES, HEMANGIOMAS - Benign normal skin lesions - Benign-appearing - Call for any changes  MELANOCYTIC NEVI - Tan-brown and/or pink-flesh-colored symmetric macules and papules - Benign appearing on exam today - Observation - Call clinic for new or changing moles - Recommend daily use of broad spectrum spf 30+ sunscreen to sun-exposed areas.   Sebaceous Hyperplasia - Small yellow papules with a central dell - Benign-appearing - Observe. Call for changes.  NEOPLASM OF UNCERTAIN BEHAVIOR OF SKIN Left Thigh - Posterior Epidermal / dermal shaving  Lesion diameter (cm):  0.5 Informed consent: discussed and consent obtained   Timeout: patient name, date of birth, surgical site, and procedure verified   Procedure prep:  Patient was prepped and draped in usual sterile fashion Prep type:  Isopropyl alcohol Anesthesia: the lesion was anesthetized in a standard fashion   Anesthetic:  1% lidocaine  w/ epinephrine  1-100,000 buffered w/ 8.4% NaHCO3 Instrument used: DermaBlade   Hemostasis achieved with: aluminum chloride   Outcome: patient tolerated procedure well   Post-procedure details: sterile dressing applied and wound care instructions given   Dressing type: petrolatum   Additional details:  Pt aware that benign results will be sent to  mychart and the staff will call abnormal results will  Specimen A - Surgical pathology Differential Diagnosis: R/O NMSC  Check Margins: No  No follow-ups on file.  I, Jetta Ager, am acting  as Neurosurgeon for [employer], DO.  Documentation: I have reviewed the above documentation for accuracy and completeness, and I agree with the above.  Delon Lenis, DO

## 2024-05-22 LAB — SURGICAL PATHOLOGY

## 2024-05-24 ENCOUNTER — Ambulatory Visit: Payer: Self-pay | Admitting: Dermatology

## 2024-06-29 ENCOUNTER — Other Ambulatory Visit: Payer: Self-pay | Admitting: Family Medicine

## 2024-06-29 DIAGNOSIS — E782 Mixed hyperlipidemia: Secondary | ICD-10-CM

## 2024-06-30 ENCOUNTER — Other Ambulatory Visit: Payer: Self-pay | Admitting: Family Medicine

## 2024-06-30 DIAGNOSIS — Z1231 Encounter for screening mammogram for malignant neoplasm of breast: Secondary | ICD-10-CM

## 2024-07-14 NOTE — Progress Notes (Signed)
 Office Visit Note  Patient: Betty Foster             Date of Birth: 1966-01-04           MRN: [id number]             PCP: Ozell Heron HERO, MD Referring: Alm Delon SAILOR, DO Visit Date: 07/28/2024 Occupation: Director of Business/Financial Services  Subjective:  Rash, arthralgias, positive ANA  History of Present Illness: Betty Foster is a 58 y.o. female seen for the evaluation of positive ANA.  According to the patient in April 2025 she developed some discoloration on her ankles.  She went to see Dr. Alm.  I reviewed note from Dr. Lyndee visit she reviewed the patient had nonpalpable purpura on her feet and lower legs which occurred after severe flulike illness in February.  She was prescribed triamcinolone  twice a day for 2 weeks and tacrolimus  twice a day for 2 weeks and the symptoms resolved.  At that time she also had some swelling on her feet which resolved.  She took Aleve for 3 to 4 days.  Dr. Alm also did extensive labs and positive ANA was noted for that reason she was referred to me.  Patient denies any history of fatigue, sicca symptoms, oral ulcers, nasal ulcers, malar rash, Raynaud's phenomenon, lymphadenopathy or inflammatory arthritis.  She gives history of photosensitivity.  She also gives history of morning stiffness and stiffness after prolonged sitting for the last few years.    Activities of Daily Living:  Patient reports morning stiffness for 5 minutes.   Patient Denies nocturnal pain.  Difficulty dressing/grooming: Denies Difficulty climbing stairs: Denies Difficulty getting out of chair: Denies Difficulty using hands for taps, buttons, cutlery, and/or writing: Denies  Review of Systems  Constitutional:  Negative for fatigue.  HENT:  Negative for mouth sores and mouth dryness.   Eyes:  Negative for dryness.  Respiratory:  Negative for shortness of breath.   Cardiovascular:  Negative for chest pain and palpitations.  Gastrointestinal:   Negative for blood in stool, constipation and diarrhea.  Endocrine: Positive for increased urination.  Genitourinary:  Negative for painful urination and involuntary urination.  Musculoskeletal:  Positive for morning stiffness. Negative for joint pain, gait problem, joint pain, joint swelling, myalgias, muscle weakness, muscle tenderness and myalgias.  Skin:  Positive for sensitivity to sunlight. Negative for color change, rash and hair loss.  Allergic/Immunologic: Negative for susceptible to infections.  Neurological:  Negative for dizziness and headaches.  Hematological:  Negative for swollen glands.  Psychiatric/Behavioral:  Negative for depressed mood and sleep disturbance. The patient is not nervous/anxious.     PMFS History:  Patient Active Problem List   Diagnosis Date Noted   Hyperlipidemia 05/18/2023   Prediabetes 05/20/2022   HTN (hypertension) 05/20/2022   Hypothyroidism due to Hashimoto's thyroiditis 05/17/2019   Hashimoto's thyroiditis 05/17/2019   Melanoma in situ (HCC) 08/13/2015   Breast cancer of upper-outer quadrant of right female breast (HCC) 07/19/2014   Family history of breast cancer 07/19/2014   Family history of colon cancer 07/19/2014    Past Medical History:  Diagnosis Date   Atypical nevus 06/12/2015   severe atypical mole--mid back tx 09/12/2015 excision   Atypical nevus 12/22/2016   mod-severe mid back tx widershave 01/21/17   Atypical nevus 01/21/2017   mid back    Breast cancer (HCC) 06/2014   Invasive Lobular Carcinoma dx at age 44. Planning lumpectomy on 08/03/2014. Chemotherapy and radiation to be determined.  History of chicken pox    Hyperlipemia    Hypertension    meds   Melanoma in situ (HCC) 06/12/2015   left thigh--Tx by Dr. Gregorio   Nodulo-ulcerative basal cell carcinoma (BCC) 06/12/2015   Right Chest   Personal history of radiation therapy    Thyroid  disease     Family History  Problem Relation Age of Onset   Hypothyroidism  Mother    High blood pressure Mother    High Cholesterol Mother    Hyperlipidemia Mother    Hypertension Mother    Colon polyps Father    Cancer Father 9       skin cancer, ? if melanoma   Diabetes Father    Hyperlipidemia Father    Hypertension Father    Kidney disease Father    Rheum arthritis Father    Healthy Brother    Cancer Maternal Aunt 68       colorectal cancer   Colon cancer Maternal Aunt    Breast cancer Paternal Aunt 51   Cancer Paternal Aunt    Prostate cancer Paternal Uncle 4   Ovarian cancer Maternal Grandmother 23 - 59   Cancer Maternal Grandmother    Lung cancer Maternal Grandfather 72   Cancer Maternal Grandfather    Hyperlipidemia Paternal Grandmother    CVA Paternal Grandmother 18   Heart attack Paternal Grandfather 69   Hyperlipidemia Paternal Grandfather    Hearing loss Paternal Grandfather    Depression Daughter    Hyperlipidemia Daughter    Healthy Daughter    Esophageal cancer Neg Hx    Rectal cancer Neg Hx    Stomach cancer Neg Hx    BRCA 1/2 Neg Hx    Past Surgical History:  Procedure Laterality Date   BREAST LUMPECTOMY Right 07/2014   BREAST LUMPECTOMY WITH NEEDLE LOCALIZATION AND AXILLARY SENTINEL LYMPH NODE BX Right 08/03/2014   Procedure: NEEDLE LOCALIZED RIGHT BREAST LUMPECTOMY AND RIGHT  SENTINEL LYMPH NODE BX;  Surgeon: Vicenta Poli, MD;  Location: [facility];  Service: General;  Laterality: Right;   Social History   Tobacco Use   Smoking status: Never    Passive exposure: Current   Smokeless tobacco: Never  Vaping Use   Vaping status: Never Used  Substance Use Topics   Alcohol use: Yes    Comment: socially   Drug use: No   Social History   Social History Narrative   Not on file     Immunization History  Administered Date(s) Administered   Influenza-Unspecified 07/08/2021, 05/30/2023   Moderna Sars-Covid-2 Vaccination 10/12/2019, 11/09/2019   Td 04/23/2015   Zoster Recombinant(Shingrix)  05/15/2020, 07/30/2020     Objective: Vital Signs: BP 97/64 (BP Location: Left Arm, Patient Position: Sitting, Cuff Size: Normal)   Pulse 76   Temp 97.6 F (36.4 C)   Resp 16   Ht 5' 2.5 (1.588 m)   Wt 136 lb 3.2 oz (61.8 kg)   LMP 05/03/2019 (Exact Date)   BMI 24.51 kg/m    Physical Exam Vitals and nursing note reviewed.  Constitutional:      Appearance: She is well-developed.  HENT:     Head: Normocephalic and atraumatic.  Eyes:     Conjunctiva/sclera: Conjunctivae normal.  Cardiovascular:     Rate and Rhythm: Normal rate and regular rhythm.     Heart sounds: Normal heart sounds.  Pulmonary:     Effort: Pulmonary effort is normal.     Breath sounds: Normal breath sounds.  Abdominal:  General: Bowel sounds are normal.     Palpations: Abdomen is soft.  Musculoskeletal:     Cervical back: Normal range of motion.  Lymphadenopathy:     Cervical: No cervical adenopathy.  Skin:    General: Skin is warm and dry.     Capillary Refill: Capillary refill takes less than 2 seconds.     Comments: She had good capillary refill.  No sclerodactyly, nailbed capillary changes were noted.  Neurological:     Mental Status: She is alert and oriented to person, place, and time.  Psychiatric:        Behavior: Behavior normal.      Musculoskeletal Exam: Cervical, thoracic and lumbar spine were in good range of motion.  There was no SI joint tenderness.  Shoulder joints, elbow joints, wrist joints, MCPs, PIPs and DIPs were in good range of motion with no synovitis.  Hip joints and knee joints were in good range of motion without any warmth swelling or effusion.  There was no tenderness over ankles or MTPs.    This pictures are from December 27, 2023 taken from patient's cell phone.  CDAI Exam: CDAI Score: -- Patient Global: --; Provider Global: -- Swollen: --; Tender: -- Joint Exam 07/28/2024   No joint exam has been documented for this visit   There is currently no information  documented on the homunculus. Go to the Rheumatology activity and complete the homunculus joint exam.  Investigation: No additional findings.  Imaging: No results found.  Recent Labs: Lab Results  Component Value Date   WBC 6.7 01/28/2024   HGB 12.0 01/28/2024   PLT 192 01/28/2024   NA 140 01/28/2024   K 4.3 01/28/2024   CL 101 01/28/2024   CO2 24 01/28/2024   GLUCOSE 89 01/28/2024   BUN 15 01/28/2024   CREATININE 0.99 01/28/2024   BILITOT 0.5 01/28/2024   ALKPHOS 79 01/28/2024   AST 16 01/28/2024   ALT 23 01/28/2024   PROT 7.0 01/28/2024   ALBUMIN 4.5 01/28/2024   CALCIUM  9.6 01/28/2024    December 03, 2023 triglycerides 178, hemoglobin A1c 5.6, TSH 0.74 Jan 28, 2024 ANA 1: 320, ENA (dsDNA, RNP, Smith, SSA, SSB, chromatin,  ribosomal P, centromere SCL 70, Jo 1) negative, MPO negative, PR-3 negative, c-ANCA negative, p-ANCA negative  May 18, 2024 skin biopsy-lichenoid actinic keratosis  Speciality Comments: No specialty comments available.  Procedures:  No procedures performed Allergies: Patient has no known allergies.   Assessment / Plan:     Visit Diagnoses: Positive ANA (antinuclear antibody)-patient was referred to me for positive ANA.  Her ANA was positive in March 2025.  ENA panel was negative.  She denies any history of oral ulcers, nasal ulcers, malar rash, Raynaud's phenomenon, inflammatory arthritis, lymphadenopathy.  She gives history of photosensitivity.  No synovitis was noted on the examination.  I did detailed discussion with the patient about presence of ANA after a viral infection and also in 15% of normal population.  We discussed the option of repeating ANA but patient would like to hold off at this time.  I advised her to contact me if she develops any new symptoms.  She will be returning on as needed basis.  Rash-she brought the pictures of her rash on her cell phone from April which I added to the chart.  The rash appears to be purpuric which resolved  in couple of weeks after using topical steroids.  She had no recurrence of the rash.  Polyarthralgia-she gives history  of joint and stiffness after prolonged sitting and some morning stiffness.  There is no history of joint swelling.  No synovitis was noted on the examination.  Chronic right-sided low back pain without sciatica-she complains of some discomfort in the right paravertebral region.  Which she notices after prolonged sitting.  She had no point tenderness over the lumbar spine.  A handout on back exercises was given.  Other medical problems are listed as follows:  Malignant neoplasm of upper-outer quadrant of right breast in female, estrogen receptor positive (HCC) - 2015, lumpectomy, RTX, Tamoxifen  x 10 years.  Hashimoto's thyroiditis  Primary hypertension  Mixed hyperlipidemia  Melanoma in situ of other site [facility]) - 2015, left thigh, resection,back. Dr. Livingston.  Prediabetes  Family history of breast cancer  Family history of colon cancer  Orders: No orders of the defined types were placed in this encounter.  No orders of the defined types were placed in this encounter.    Follow-Up Instructions: Return if symptoms worsen or fail to improve, for +ANA.   Maya Nash, MD  Note - This record has been created using Animal nutritionist.  Chart creation errors have been sought, but may not always  have been located. Such creation errors do not reflect on  the standard of medical care.

## 2024-07-28 ENCOUNTER — Encounter: Payer: Self-pay | Admitting: Rheumatology

## 2024-07-28 ENCOUNTER — Ambulatory Visit: Payer: PRIVATE HEALTH INSURANCE | Attending: Rheumatology | Admitting: Rheumatology

## 2024-07-28 VITALS — BP 97/64 | HR 76 | Temp 97.6°F | Resp 16 | Ht 62.5 in | Wt 136.2 lb

## 2024-07-28 DIAGNOSIS — R21 Rash and other nonspecific skin eruption: Secondary | ICD-10-CM | POA: Diagnosis not present

## 2024-07-28 DIAGNOSIS — Z800 Family history of malignant neoplasm of digestive organs: Secondary | ICD-10-CM

## 2024-07-28 DIAGNOSIS — E782 Mixed hyperlipidemia: Secondary | ICD-10-CM

## 2024-07-28 DIAGNOSIS — R7689 Other specified abnormal immunological findings in serum: Secondary | ICD-10-CM | POA: Diagnosis not present

## 2024-07-28 DIAGNOSIS — M545 Low back pain, unspecified: Secondary | ICD-10-CM | POA: Diagnosis not present

## 2024-07-28 DIAGNOSIS — M2550 Pain in unspecified joint: Secondary | ICD-10-CM | POA: Diagnosis not present

## 2024-07-28 DIAGNOSIS — C50411 Malignant neoplasm of upper-outer quadrant of right female breast: Secondary | ICD-10-CM

## 2024-07-28 DIAGNOSIS — Z170 Estrogen receptor positive status [ER+]: Secondary | ICD-10-CM

## 2024-07-28 DIAGNOSIS — I10 Essential (primary) hypertension: Secondary | ICD-10-CM

## 2024-07-28 DIAGNOSIS — R7303 Prediabetes: Secondary | ICD-10-CM

## 2024-07-28 DIAGNOSIS — G8929 Other chronic pain: Secondary | ICD-10-CM

## 2024-07-28 DIAGNOSIS — E063 Autoimmune thyroiditis: Secondary | ICD-10-CM

## 2024-07-28 DIAGNOSIS — Z803 Family history of malignant neoplasm of breast: Secondary | ICD-10-CM

## 2024-07-28 DIAGNOSIS — D038 Melanoma in situ of other sites: Secondary | ICD-10-CM

## 2024-07-28 NOTE — Patient Instructions (Signed)
 Low Back Sprain or Strain Rehab Ask your health care provider which exercises are safe for you. Do exercises exactly as told by your health care provider and adjust them as directed. It is normal to feel mild stretching, pulling, tightness, or discomfort as you do these exercises. Stop right away if you feel sudden pain or your pain gets worse. Do not begin these exercises until told by your health care provider. Stretching and range-of-motion exercises These exercises warm up your muscles and joints and improve the movement and flexibility of your back. These exercises also help to relieve pain, numbness, and tingling. Lumbar rotation  Lie on your back on a firm bed or the floor with your knees bent. Straighten your arms out to your sides so each arm forms a 90-degree angle (right angle) with a side of your body. Slowly move (rotate) both of your knees to one side of your body until you feel a stretch in your lower back (lumbar). Try not to let your shoulders lift off the floor. Hold this position for __________ seconds. Tense your abdominal muscles and slowly move your knees back to the starting position. Repeat this exercise on the other side of your body. Repeat __________ times. Complete this exercise __________ times a day. Single knee to chest  Lie on your back on a firm bed or the floor with both legs straight. Bend one of your knees. Use your hands to move your knee up toward your chest until you feel a gentle stretch in your lower back and buttock. Hold your leg in this position by holding on to the front of your knee. Keep your other leg as straight as possible. Hold this position for __________ seconds. Slowly return to the starting position. Repeat with your other leg. Repeat __________ times. Complete this exercise __________ times a day. Prone extension on elbows  Lie on your abdomen on a firm bed or the floor (prone position). Prop yourself up on your elbows. Use your arms  to help lift your chest up until you feel a gentle stretch in your abdomen and your lower back. This will place some of your body weight on your elbows. If this is uncomfortable, try stacking pillows under your chest. Your hips should stay down, against the surface that you are lying on. Keep your hip and back muscles relaxed. Hold this position for __________ seconds. Slowly relax your upper body and return to the starting position. Repeat __________ times. Complete this exercise __________ times a day. Strengthening exercises These exercises build strength and endurance in your back. Endurance is the ability to use your muscles for a long time, even after they get tired. Pelvic tilt This exercise strengthens the muscles that lie deep in the abdomen. Lie on your back on a firm bed or the floor with your legs extended. Bend your knees so they are pointing toward the ceiling and your feet are flat on the floor. Tighten your lower abdominal muscles to press your lower back against the floor. This motion will tilt your pelvis so your tailbone points up toward the ceiling instead of pointing to your feet or the floor. To help with this exercise, you may place a small towel under your lower back and try to push your back into the towel. Hold this position for __________ seconds. Let your muscles relax completely before you repeat this exercise. Repeat __________ times. Complete this exercise __________ times a day. Alternating arm and leg raises  Get on your hands  and knees on a firm surface. If you are on a hard floor, you may want to use padding, such as an exercise mat, to cushion your knees. Line up your arms and legs. Your hands should be directly below your shoulders, and your knees should be directly below your hips. Lift your left leg behind you. At the same time, raise your right arm and straighten it in front of you. Do not lift your leg higher than your hip. Do not lift your arm higher  than your shoulder. Keep your abdominal and back muscles tight. Keep your hips facing the ground. Do not arch your back. Keep your balance carefully, and do not hold your breath. Hold this position for __________ seconds. Slowly return to the starting position. Repeat with your right leg and your left arm. Repeat __________ times. Complete this exercise __________ times a day. Abdominal set with straight leg raise  Lie on your back on a firm bed or the floor. Bend one of your knees and keep your other leg straight. Tense your abdominal muscles and lift your straight leg up, 4-6 inches (10-15 cm) off the ground. Keep your abdominal muscles tight and hold this position for __________ seconds. Do not hold your breath. Do not arch your back. Keep it flat against the ground. Keep your abdominal muscles tense as you slowly lower your leg back to the starting position. Repeat with your other leg. Repeat __________ times. Complete this exercise __________ times a day. Single leg lower with bent knees Lie on your back on a firm bed or the floor. Tense your abdominal muscles and lift your feet off the floor, one foot at a time, so your knees and hips are bent in 90-degree angles (right angles). Your knees should be over your hips and your lower legs should be parallel to the floor. Keeping your abdominal muscles tense and your knee bent, slowly lower one of your legs so your toe touches the ground. Lift your leg back up to return to the starting position. Do not hold your breath. Do not let your back arch. Keep your back flat against the ground. Repeat with your other leg. Repeat __________ times. Complete this exercise __________ times a day. Posture and body mechanics Good posture and healthy body mechanics can help to relieve stress in your body's tissues and joints. Body mechanics refers to the movements and positions of your body while you do your daily activities. Posture is part of body  mechanics. Good posture means: Your spine is in its natural S-curve position (neutral). Your shoulders are pulled back slightly. Your head is not tipped forward (neutral). Follow these guidelines to improve your posture and body mechanics in your everyday activities. Standing  When standing, keep your spine neutral and your feet about hip-width apart. Keep a slight bend in your knees. Your ears, shoulders, and hips should line up. When you do a task in which you stand in one place for a long time, place one foot up on a stable object that is 2-4 inches (5-10 cm) high, such as a footstool. This helps keep your spine neutral. Sitting  When sitting, keep your spine neutral and keep your feet flat on the floor. Use a footrest, if necessary, and keep your thighs parallel to the floor. Avoid rounding your shoulders, and avoid tilting your head forward. When working at a desk or a computer, keep your desk at a height where your hands are slightly lower than your elbows. Slide your  chair under your desk so you are close enough to maintain good posture. When working at a computer, place your monitor at a height where you are looking straight ahead and you do not have to tilt your head forward or downward to look at the screen. Resting When lying down and resting, avoid positions that are most painful for you. If you have pain with activities such as sitting, bending, stooping, or squatting, lie in a position in which your body does not bend very much. For example, avoid curling up on your side with your arms and knees near your chest (fetal position). If you have pain with activities such as standing for a long time or reaching with your arms, lie with your spine in a neutral position and bend your knees slightly. Try the following positions: Lying on your side with a pillow between your knees. Lying on your back with a pillow under your knees. Lifting  When lifting objects, keep your feet at least  shoulder-width apart and tighten your abdominal muscles. Bend your knees and hips and keep your spine neutral. It is important to lift using the strength of your legs, not your back. Do not lock your knees straight out. Always ask for help to lift heavy or awkward objects. This information is not intended to replace advice given to you by your health care provider. Make sure you discuss any questions you have with your health care provider. Document Revised: 01/18/2023 Document Reviewed: 12/02/2020 Elsevier Patient Education  2024 [employer].

## 2024-08-02 ENCOUNTER — Ambulatory Visit
Admission: RE | Admit: 2024-08-02 | Discharge: 2024-08-02 | Disposition: A | Discharge status: Home / Self Care | Payer: PRIVATE HEALTH INSURANCE | Source: Ambulatory Visit | Attending: Family Medicine | Admitting: Family Medicine

## 2024-08-02 DIAGNOSIS — Z1231 Encounter for screening mammogram for malignant neoplasm of breast: Secondary | ICD-10-CM

## 2024-08-07 ENCOUNTER — Ambulatory Visit: Payer: Self-pay | Admitting: Family Medicine

## 2024-08-17 ENCOUNTER — Ambulatory Visit: Payer: PRIVATE HEALTH INSURANCE | Admitting: Rheumatology

## 2024-09-27 ENCOUNTER — Other Ambulatory Visit: Payer: Self-pay | Admitting: Hematology and Oncology

## 2024-10-14 ENCOUNTER — Other Ambulatory Visit: Payer: Self-pay | Admitting: Family Medicine

## 2024-10-17 ENCOUNTER — Other Ambulatory Visit: Payer: Self-pay | Admitting: Family Medicine

## 2024-12-05 ENCOUNTER — Encounter: Payer: PRIVATE HEALTH INSURANCE | Admitting: Family Medicine

## 2025-02-01 ENCOUNTER — Ambulatory Visit: Payer: PRIVATE HEALTH INSURANCE | Admitting: Hematology and Oncology

## 2025-05-23 ENCOUNTER — Ambulatory Visit: Payer: PRIVATE HEALTH INSURANCE | Admitting: Dermatology
